# Patient Record
Sex: Female | Born: 1975 | Race: Black or African American | Hispanic: No
Health system: Southern US, Community
[De-identification: ages and names within clinical notes are randomized; demographics above are authoritative.]

## PROBLEM LIST (undated history)

## (undated) DIAGNOSIS — R42 Dizziness and giddiness: Secondary | ICD-10-CM

## (undated) DIAGNOSIS — F419 Anxiety disorder, unspecified: Secondary | ICD-10-CM

## (undated) DIAGNOSIS — K219 Gastro-esophageal reflux disease without esophagitis: Secondary | ICD-10-CM

## (undated) DIAGNOSIS — G43909 Migraine, unspecified, not intractable, without status migrainosus: Secondary | ICD-10-CM

## (undated) HISTORY — PX: ABDOMINAL HYSTERECTOMY: SHX81

## (undated) HISTORY — PX: OTHER SURGICAL HISTORY: SHX169

---

## 2004-12-31 ENCOUNTER — Emergency Department: Payer: Self-pay | Admitting: Emergency Medicine

## 2005-07-30 ENCOUNTER — Emergency Department: Payer: Self-pay | Admitting: Unknown Physician Specialty

## 2006-08-11 ENCOUNTER — Emergency Department: Payer: Self-pay | Admitting: Emergency Medicine

## 2007-01-26 ENCOUNTER — Emergency Department: Payer: Self-pay | Admitting: Emergency Medicine

## 2007-06-16 ENCOUNTER — Emergency Department: Payer: Self-pay | Admitting: Emergency Medicine

## 2007-07-26 ENCOUNTER — Emergency Department: Payer: Self-pay | Admitting: Emergency Medicine

## 2007-12-24 ENCOUNTER — Other Ambulatory Visit: Payer: Self-pay

## 2007-12-24 ENCOUNTER — Emergency Department: Payer: Self-pay | Admitting: Emergency Medicine

## 2008-06-01 ENCOUNTER — Emergency Department: Payer: Self-pay | Admitting: Emergency Medicine

## 2008-06-01 ENCOUNTER — Other Ambulatory Visit: Payer: Self-pay

## 2008-06-28 ENCOUNTER — Emergency Department: Payer: Self-pay | Admitting: Emergency Medicine

## 2008-08-15 ENCOUNTER — Emergency Department: Payer: Self-pay | Admitting: Unknown Physician Specialty

## 2008-08-21 ENCOUNTER — Emergency Department: Payer: Self-pay | Admitting: Emergency Medicine

## 2008-08-21 ENCOUNTER — Emergency Department: Payer: Self-pay | Admitting: Internal Medicine

## 2008-12-28 ENCOUNTER — Emergency Department: Payer: Self-pay | Admitting: Emergency Medicine

## 2009-02-09 ENCOUNTER — Emergency Department: Payer: Self-pay | Admitting: Emergency Medicine

## 2009-06-05 ENCOUNTER — Emergency Department (HOSPITAL_COMMUNITY): Admission: EM | Admit: 2009-06-05 | Discharge: 2009-06-05 | Payer: Self-pay | Admitting: Emergency Medicine

## 2009-10-09 ENCOUNTER — Emergency Department: Payer: Self-pay | Admitting: Emergency Medicine

## 2011-01-19 LAB — URINALYSIS, ROUTINE W REFLEX MICROSCOPIC
Ketones, ur: NEGATIVE mg/dL
Leukocytes, UA: NEGATIVE
Nitrite: NEGATIVE
Urobilinogen, UA: 0.2 mg/dL (ref 0.0–1.0)
pH: 8 (ref 5.0–8.0)

## 2011-01-19 LAB — GC/CHLAMYDIA PROBE AMP, GENITAL
Chlamydia, DNA Probe: NEGATIVE
GC Probe Amp, Genital: NEGATIVE

## 2011-01-19 LAB — DIFFERENTIAL
Basophils Absolute: 0 10*3/uL (ref 0.0–0.1)
Basophils Relative: 0 % (ref 0–1)
Lymphocytes Relative: 36 % (ref 12–46)
Neutro Abs: 2.6 10*3/uL (ref 1.7–7.7)
Neutrophils Relative %: 57 % (ref 43–77)

## 2011-01-19 LAB — POCT I-STAT, CHEM 8
BUN: 4 mg/dL — ABNORMAL LOW (ref 6–23)
Calcium, Ion: 1.12 mmol/L (ref 1.12–1.32)
Chloride: 104 mEq/L (ref 96–112)
HCT: 43 % (ref 36.0–46.0)
Potassium: 3.8 mEq/L (ref 3.5–5.1)
Sodium: 140 mEq/L (ref 135–145)

## 2011-01-19 LAB — WET PREP, GENITAL
Trich, Wet Prep: NONE SEEN
Yeast Wet Prep HPF POC: NONE SEEN

## 2011-01-19 LAB — CBC
Hemoglobin: 13.2 g/dL (ref 12.0–15.0)
MCHC: 32.9 g/dL (ref 30.0–36.0)
Platelets: 303 10*3/uL (ref 150–400)
RDW: 14.3 % (ref 11.5–15.5)

## 2011-01-19 LAB — URINE MICROSCOPIC-ADD ON

## 2011-05-29 ENCOUNTER — Observation Stay: Payer: Self-pay

## 2011-11-23 ENCOUNTER — Encounter (HOSPITAL_COMMUNITY): Payer: Self-pay

## 2011-11-23 ENCOUNTER — Emergency Department (INDEPENDENT_AMBULATORY_CARE_PROVIDER_SITE_OTHER)
Admission: EM | Admit: 2011-11-23 | Discharge: 2011-11-23 | Disposition: A | Discharge status: Home / Self Care | Payer: Self-pay | Source: Home / Self Care | Attending: Family Medicine | Admitting: Family Medicine

## 2011-11-23 DIAGNOSIS — K0889 Other specified disorders of teeth and supporting structures: Secondary | ICD-10-CM

## 2011-11-23 DIAGNOSIS — K089 Disorder of teeth and supporting structures, unspecified: Secondary | ICD-10-CM

## 2011-11-23 MED ORDER — DICLOFENAC POTASSIUM 50 MG PO TABS: 50.0000 mg | ORAL_TABLET | Freq: Three times a day (TID) | ORAL | Status: AC

## 2011-11-23 MED ORDER — CLINDAMYCIN HCL 150 MG PO CAPS: 150.0000 mg | ORAL_CAPSULE | Freq: Four times a day (QID) | ORAL | Status: AC

## 2011-11-23 NOTE — ED Notes (Signed)
Pt has lt sided lower toothache that started two weeks ago.

## 2011-11-23 NOTE — ED Provider Notes (Signed)
History     CSN: [id number]  Arrival date & time 11/23/11  7829   First MD Initiated Contact with Patient 11/23/11 1923      No chief complaint on file.   (Consider location/radiation/quality/duration/timing/severity/associated sxs/prior treatment) Patient is a 36 y.o. female presenting with tooth pain. The history is provided by the patient.  Dental PainThe primary symptoms include mouth pain. Primary symptoms do not include fever. The symptoms began more than 1 week ago. The symptoms are worsening. The symptoms occur constantly.  Additional symptoms include: dental sensitivity to temperature, jaw pain and ear pain.    No past medical history on file.  No past surgical history on file.  No family history on file.  History  Substance Use Topics  . Smoking status: Not on file  . Smokeless tobacco: Not on file  . Alcohol Use: Not on file    OB History    No data available      Review of Systems  Constitutional: Negative.  Negative for fever.  HENT: Positive for ear pain and dental problem.     Allergies  Review of patient's allergies indicates not on file.  Home Medications   Current Outpatient Rx  Name Route Sig Dispense Refill  . CLINDAMYCIN HCL 150 MG PO CAPS Oral Take 1 capsule (150 mg total) by mouth every 6 (six) hours. Start with 2 caps tonight 28 capsule 0  . DICLOFENAC POTASSIUM 50 MG PO TABS Oral Take 1 tablet (50 mg total) by mouth 3 (three) times daily. For tooth pain 15 tablet 0    BP 137/82  Pulse 84  Temp(Src) 98.6 F (37 C) (Oral)  Resp 20  SpO2 99%  Physical Exam  Nursing note and vitals reviewed. Constitutional: She is oriented to person, place, and time. She appears well-developed and well-nourished. She appears distressed.  HENT:  Head: Normocephalic.  Right Ear: External ear normal.  Left Ear: External ear normal.  Mouth/Throat:    Neck: Normal range of motion. Neck supple.  Lymphadenopathy:    She has cervical adenopathy.    Neurological: She is alert and oriented to person, place, and time.  Skin: Skin is warm and dry.  Psychiatric: She has a normal mood and affect.    ED Course  Procedures (including critical care time)  Labs Reviewed - No data to display No results found.   1. Pain, dental       MDM         Barkley Bruns, MD 11/23/11 [phone]

## 2012-05-20 ENCOUNTER — Ambulatory Visit: Payer: Self-pay | Admitting: Obstetrics and Gynecology

## 2012-05-20 LAB — CBC
MCH: 25.6 pg — ABNORMAL LOW (ref 26.0–34.0)
Platelet: 314 10*3/uL (ref 150–440)
RBC: 4.99 10*6/uL (ref 3.80–5.20)
WBC: 6 10*3/uL (ref 3.6–11.0)

## 2012-05-20 LAB — URINALYSIS, COMPLETE
Bacteria: NONE SEEN
Glucose,UR: NEGATIVE mg/dL (ref 0–75)
Ketone: NEGATIVE
Leukocyte Esterase: NEGATIVE
Nitrite: NEGATIVE
Ph: 5 (ref 4.5–8.0)
Protein: 30
Specific Gravity: 1.029 (ref 1.003–1.030)
WBC UR: 1 /HPF (ref 0–5)

## 2012-05-20 LAB — PREGNANCY, URINE: Pregnancy Test, Urine: NEGATIVE m[IU]/mL

## 2012-05-22 ENCOUNTER — Ambulatory Visit: Payer: Self-pay | Admitting: Obstetrics and Gynecology

## 2012-05-23 LAB — HEMATOCRIT: HCT: 33.3 % — ABNORMAL LOW (ref 35.0–47.0)

## 2012-08-16 ENCOUNTER — Encounter (HOSPITAL_COMMUNITY): Payer: Self-pay | Admitting: Emergency Medicine

## 2012-08-16 ENCOUNTER — Emergency Department (INDEPENDENT_AMBULATORY_CARE_PROVIDER_SITE_OTHER)
Admission: EM | Admit: 2012-08-16 | Discharge: 2012-08-16 | Disposition: A | Discharge status: Home / Self Care | Payer: Medicaid Other | Source: Home / Self Care | Attending: Emergency Medicine | Admitting: Emergency Medicine

## 2012-08-16 DIAGNOSIS — J019 Acute sinusitis, unspecified: Secondary | ICD-10-CM

## 2012-08-16 DIAGNOSIS — J069 Acute upper respiratory infection, unspecified: Secondary | ICD-10-CM

## 2012-08-16 DIAGNOSIS — J209 Acute bronchitis, unspecified: Secondary | ICD-10-CM

## 2012-08-16 LAB — POCT RAPID STREP A: Streptococcus, Group A Screen (Direct): NEGATIVE

## 2012-08-16 MED ORDER — AMOXICILLIN 500 MG PO CAPS: 1000.0000 mg | ORAL_CAPSULE | Freq: Three times a day (TID) | ORAL | Status: DC

## 2012-08-16 MED ORDER — PREDNISONE 5 MG PO KIT: 1.0000 | PACK | Freq: Every day | ORAL | Status: DC

## 2012-08-16 MED ORDER — ALBUTEROL SULFATE HFA 108 (90 BASE) MCG/ACT IN AERS: 1.0000 | INHALATION_SPRAY | Freq: Four times a day (QID) | RESPIRATORY_TRACT | Status: DC | PRN

## 2012-08-16 MED ORDER — BENZONATATE 200 MG PO CAPS: 200.0000 mg | ORAL_CAPSULE | Freq: Three times a day (TID) | ORAL | Status: DC | PRN

## 2012-08-16 NOTE — ED Provider Notes (Signed)
Chief Complaint  Patient presents with  . URI    ;cold symptoms x 1 wk. sore throat, nausea, chest pain with cough.     History of Present Illness:   The patient is a 36 year old female who presents tonight with a one-week history of cough productive of green sputum, along with sternal chest pain which is worse with deep inspiration and coughing. She also has nasal congestion and rhinorrhea with green drainage, headache, and popping of the ears. She feels achy all over, hot and cold, has had sweats, malaise, and fatigue. She had sore throat and hoarseness in her eyes have been watering. She's felt somewhat nauseated but had no vomiting but she has had some diarrhea. She's had no specific exposures to anything in particular. She tried Alka-Seltzer cold without relief. She is allergic to aspirin. She is status post hysterectomy. She has a history of asthma in the past.  Review of Systems:  Other than noted above, the patient denies any of the following symptoms. Systemic:  No fever, chills, sweats, fatigue, myalgias, headache, or anorexia. Eye:  No redness, pain or drainage. ENT:  No earache, ear congestion, nasal congestion, sneezing, rhinorrhea, sinus pressure, sinus pain, post nasal drip, or sore throat. Lungs:  No cough, sputum production, wheezing, shortness of breath, or chest pain. GI:  No abdominal pain, nausea, vomiting, or diarrhea.  PMFSH:  Past medical history, family history, social history, meds, and allergies were reviewed.  Physical Exam:   Vital signs:  BP 132/87  Pulse 90  Temp 98.6 F (37 C) (Oral)  Resp 18  SpO2 96%  LMP 11/01/2011 General:  Alert, in no distress. Eye:  No conjunctival injection or drainage. Lids were normal. ENT:  TMs and canals were normal, without erythema or inflammation.  Nasal mucosa was clear and uncongested, without drainage.  Mucous membranes were moist.  Pharynx was clear, without exudate or drainage.  There were no oral ulcerations or  lesions. Neck:  Supple, no adenopathy, tenderness or mass. Lungs:  No respiratory distress.  Lungs were clear to auscultation, without wheezes, rales or rhonchi.  Breath sounds were clear and equal bilaterally.  Heart:  Regular rhythm, without gallops, murmers or rubs. Skin:  Clear, warm, and dry, without rash or lesions.  Labs:   Results for orders placed during the hospital encounter of 08/16/12  POCT RAPID STREP A ([facility] ONLY)      Component Value Range   Streptococcus, Group A Screen (Direct) NEGATIVE  NEGATIVE   Assessment:  The primary encounter diagnosis was Viral upper respiratory infection. Diagnoses of Acute bronchitis and Acute sinusitis were also pertinent to this visit.  Plan:   1.  The following meds were prescribed:   New Prescriptions   ALBUTEROL (PROVENTIL HFA;VENTOLIN HFA) 108 (90 BASE) MCG/ACT INHALER    Inhale 1-2 puffs into the lungs every 6 (six) hours as needed for wheezing.   AMOXICILLIN (AMOXIL) 500 MG CAPSULE    Take 2 capsules (1,000 mg total) by mouth 3 (three) times daily.   BENZONATATE (TESSALON) 200 MG CAPSULE    Take 1 capsule (200 mg total) by mouth 3 (three) times daily as needed for cough.   PREDNISONE 5 MG KIT    Take 1 kit (5 mg total) by mouth daily after breakfast. Prednisone 5 mg 6 day dosepack.  Take as directed.   2.  The patient was instructed in symptomatic care and handouts were given. 3.  The patient was told to return if becoming  worse in any way, if no better in 3 or 4 days, and given some red flag symptoms that would indicate earlier return.   Reuben Likes, MD 08/16/12 [phone]

## 2012-08-16 NOTE — ED Notes (Signed)
Pt states that symptoms started a week ago.   Pt c/o HA, chest pain with productive cough (green sputum), nausea, runny nose Pt has felt feverish but did not check temp.   Cough keeps her awake at night. First couple days of sickness pt did have diarrhea that has subsided.   Denies vomiting.

## 2012-09-22 ENCOUNTER — Emergency Department (INDEPENDENT_AMBULATORY_CARE_PROVIDER_SITE_OTHER)
Admission: EM | Admit: 2012-09-22 | Discharge: 2012-09-22 | Disposition: A | Discharge status: Home / Self Care | Payer: Medicaid Other | Source: Home / Self Care | Attending: Emergency Medicine | Admitting: Emergency Medicine

## 2012-09-22 ENCOUNTER — Encounter (HOSPITAL_COMMUNITY): Payer: Self-pay | Admitting: Emergency Medicine

## 2012-09-22 DIAGNOSIS — L989 Disorder of the skin and subcutaneous tissue, unspecified: Secondary | ICD-10-CM

## 2012-09-22 DIAGNOSIS — R51 Headache: Secondary | ICD-10-CM

## 2012-09-22 MED ORDER — ACETAMINOPHEN-CODEINE #3 300-30 MG PO TABS: 1.0000 | ORAL_TABLET | Freq: Four times a day (QID) | ORAL | Status: DC | PRN

## 2012-09-22 NOTE — ED Notes (Signed)
Reports having a headache for a month, pain eases, but has not gone away.  Reports cough at night.  Minimal sniffles, no known fever.  Dizziness has been intermittent the same duration as headache.  Patient also concerned for knot on back of head that she noticed yesterday once her weave was removed.

## 2012-09-22 NOTE — ED Provider Notes (Signed)
History     CSN: [id number]  Arrival date & time 09/22/12  1224   First MD Initiated Contact with Patient 09/22/12 1314      Chief Complaint  Patient presents with  . Headache    (Consider location/radiation/quality/duration/timing/severity/associated sxs/prior treatment) HPI Comments: Patient presents urgent care complaining that for about a month she's been having a headache, it comes and goes and she has also noticed a little bump that is sore on the left side of her scalp area. Also describes that she used to have migraines and she feels this is very similar and she is also nauseous and that light bothers her a bit. Patient denies any further symptoms such as visual changes, ambulation or gait problems, paresthesias.   Patient is a 36 y.o. female presenting with headaches. The history is provided by the patient.  Headache The primary symptoms include headaches and dizziness. Primary symptoms do not include syncope, loss of consciousness, altered mental status, seizures, visual change, paresthesias, focal weakness, loss of sensation, speech change, memory loss, fever, nausea or vomiting. Episode onset: an=bout 1 month. The symptoms are unchanged.  The headache is not associated with visual change, neck stiffness, paresthesias or weakness.  Dizziness does not occur with tinnitus, nausea, vomiting or weakness.  Additional symptoms do not include neck stiffness, weakness, taste disturbance, hearing loss, tinnitus or vertigo. Medical issues do not include cerebral vascular accident, alcohol use or hypertension.    Past Medical History  Diagnosis Date  . Asthma     Past Surgical History  Procedure Date  . Abdominal hysterectomy     No family history on file.  History  Substance Use Topics  . Smoking status: Never Smoker   . Smokeless tobacco: Not on file  . Alcohol Use: No    OB History    Grav Para Term Preterm Abortions TAB SAB Ect Mult Living                   Review of Systems  Constitutional: Positive for activity change. Negative for fever, chills and appetite change.  HENT: Negative for hearing loss, neck stiffness and tinnitus.   Cardiovascular: Negative for syncope.  Gastrointestinal: Negative for nausea and vomiting.  Skin: Negative for rash and wound.  Neurological: Positive for dizziness and headaches. Negative for vertigo, speech change, focal weakness, seizures, loss of consciousness, weakness and paresthesias.  Psychiatric/Behavioral: Negative for memory loss and altered mental status.    Allergies  Aspirin  Home Medications   Current Outpatient Rx  Name  Route  Sig  Dispense  Refill  . ACETAMINOPHEN 500 MG PO TABS   Oral   Take 500 mg by mouth every 6 (six) hours as needed.         . ACETAMINOPHEN-CODEINE #3 300-30 MG PO TABS   Oral   Take 1-2 tablets by mouth every 6 (six) hours as needed for pain.   15 tablet   0   . ALBUTEROL SULFATE HFA 108 (90 BASE) MCG/ACT IN AERS   Inhalation   Inhale 1-2 puffs into the lungs every 6 (six) hours as needed for wheezing.   1 Inhaler   0   . AMOXICILLIN 500 MG PO CAPS   Oral   Take 2 capsules (1,000 mg total) by mouth 3 (three) times daily.   60 capsule   0     Dispense as written.   Marland Kitchen BENZONATATE 200 MG PO CAPS   Oral   Take 1 capsule (200 mg  total) by mouth 3 (three) times daily as needed for cough.   30 capsule   0   . DICLOFENAC POTASSIUM 50 MG PO TABS   Oral   Take 1 tablet (50 mg total) by mouth 3 (three) times daily. For tooth pain   15 tablet   0   . PREDNISONE 5 MG PO KIT   Oral   Take 1 kit (5 mg total) by mouth daily after breakfast. Prednisone 5 mg 6 day dosepack.  Take as directed.   1 kit   0     LMP 11/01/2011  Physical Exam  Nursing note and vitals reviewed. Constitutional: She is oriented to person, place, and time. Vital signs are normal. She appears well-developed and well-nourished.  Non-toxic appearance. She does not have a  sickly appearance. No distress.  HENT:  Head: Normocephalic.  Eyes: Conjunctivae normal are normal.  Neck: Neck supple. No JVD present.  Cardiovascular: Normal rate.   No murmur heard. Pulmonary/Chest: Effort normal and breath sounds normal.  Lymphadenopathy:    She has no cervical adenopathy.  Neurological: She is alert and oriented to person, place, and time. She displays normal reflexes. No cranial nerve deficit. She exhibits normal muscle tone. Coordination normal.  Skin: Skin is warm.  Psychiatric: Her speech is normal. Her mood appears not anxious.    ED Course  Procedures (including critical care time)  Labs Reviewed - No data to display No results found.   1. Headache   2. Scalp lesion       MDM  Tensional versus migraine type headache. Patient has been prescribed a Tylenol No. 3 prescription with 15 tablets and she agrees to followup with her primary care Dr. Patient also seems to have a early sunglasses cyst formation versus a palpable lymphadenopathy on her left parietal region on the left side. Encourage her to use warm compresses to followup per Dr. as if it continues to grow bother her to obtain a referral for elective surgical removal.        Jimmie Molly, MD 09/22/12 1400

## 2013-03-09 ENCOUNTER — Emergency Department (HOSPITAL_COMMUNITY)
Admission: EM | Admit: 2013-03-09 | Discharge: 2013-03-09 | Disposition: A | Discharge status: Home / Self Care | Payer: Self-pay | Source: Home / Self Care

## 2013-03-09 ENCOUNTER — Encounter (HOSPITAL_COMMUNITY): Payer: Self-pay | Admitting: Adult Health

## 2013-03-09 ENCOUNTER — Emergency Department (HOSPITAL_COMMUNITY)
Admission: EM | Admit: 2013-03-09 | Discharge: 2013-03-09 | Disposition: A | Discharge status: Home / Self Care | Payer: Medicaid Other | Attending: Emergency Medicine | Admitting: Emergency Medicine

## 2013-03-09 DIAGNOSIS — J45909 Unspecified asthma, uncomplicated: Secondary | ICD-10-CM | POA: Insufficient documentation

## 2013-03-09 DIAGNOSIS — Y998 Other external cause status: Secondary | ICD-10-CM | POA: Insufficient documentation

## 2013-03-09 DIAGNOSIS — Y929 Unspecified place or not applicable: Secondary | ICD-10-CM | POA: Insufficient documentation

## 2013-03-09 DIAGNOSIS — Z888 Allergy status to other drugs, medicaments and biological substances status: Secondary | ICD-10-CM | POA: Insufficient documentation

## 2013-03-09 DIAGNOSIS — T148XXA Other injury of unspecified body region, initial encounter: Secondary | ICD-10-CM

## 2013-03-09 DIAGNOSIS — X58XXXA Exposure to other specified factors, initial encounter: Secondary | ICD-10-CM | POA: Insufficient documentation

## 2013-03-09 DIAGNOSIS — S335XXA Sprain of ligaments of lumbar spine, initial encounter: Secondary | ICD-10-CM | POA: Insufficient documentation

## 2013-03-09 DIAGNOSIS — Z79899 Other long term (current) drug therapy: Secondary | ICD-10-CM | POA: Insufficient documentation

## 2013-03-09 MED ORDER — OXYCODONE-ACETAMINOPHEN 5-325 MG PO TABS
1.0000 | ORAL_TABLET | Freq: Once | ORAL | Status: AC
  Administered 2013-03-09: 1 via ORAL
  Filled 2013-03-09: qty 1

## 2013-03-09 MED ORDER — IBUPROFEN 200 MG PO TABS
400.0000 mg | ORAL_TABLET | Freq: Once | ORAL | Status: AC
  Administered 2013-03-09: 400 mg via ORAL
  Filled 2013-03-09: qty 2

## 2013-03-09 MED ORDER — CYCLOBENZAPRINE HCL 10 MG PO TABS: 10.0000 mg | ORAL_TABLET | Freq: Two times a day (BID) | ORAL | Status: DC | PRN

## 2013-03-09 MED ORDER — IBUPROFEN 800 MG PO TABS: 800.0000 mg | ORAL_TABLET | Freq: Three times a day (TID) | ORAL | Status: DC

## 2013-03-09 MED ORDER — HYDROCODONE-ACETAMINOPHEN 5-325 MG PO TABS: 1.0000 | ORAL_TABLET | ORAL | Status: DC | PRN

## 2013-03-09 NOTE — ED Notes (Signed)
Presents with left lower back pain that began while reaching for something and then quickly jumping back because she saw a bug. Pt states, "I think I jumped the wrong way. It hurts to move the left arm and walk. No deformity noted. Pain is described as constant and very painful. Injury occurred yesterday. CMS intact.

## 2013-03-09 NOTE — ED Provider Notes (Signed)
History    This chart was scribed for non-physician practitioner, Arnoldo Hooker PA-C working with Glynn Octave, MD by Donne Anon, ED Scribe. This patient was seen in room TR11C/TR11C and the patient's care was started at 1850.   CSN: [id number]  Arrival date & time 03/09/13  1543   First MD Initiated Contact with Patient 03/09/13 1850      Chief Complaint  Patient presents with  . Back Pain    The history is provided by the patient. No language interpreter was used.   HPI Comments: Danielle Pitts is a 37 y.o. female who presents to the Emergency Department complaining of sudden onset, gradually worsening, waxing and waning, moderate right lower back pain which began yesterday when she was reaching for something in a cabinet. She states the pain is worse with deep breaths and certain movements. She denies CP, abdominal pain, nausea, vomiting or any other pain. She reports she is otherwise healthy.    Her PCP is Dr. Bobbye Riggs.  Past Medical History  Diagnosis Date  . Asthma     Past Surgical History  Procedure Laterality Date  . Abdominal hysterectomy      History reviewed. No pertinent family history.  History  Substance Use Topics  . Smoking status: Never Smoker   . Smokeless tobacco: Not on file  . Alcohol Use: No     Review of Systems  Constitutional: Negative for fever.  HENT: Negative for neck pain.   Respiratory: Negative for cough and shortness of breath.   Cardiovascular: Negative for chest pain.  Gastrointestinal: Negative for nausea, vomiting and abdominal pain.  Musculoskeletal: Positive for back pain.    Allergies  Aspirin  Home Medications   Current Outpatient Rx  Name  Route  Sig  Dispense  Refill  . acetaminophen (TYLENOL) 500 MG tablet   Oral   Take 1,000 mg by mouth every 6 (six) hours as needed for pain.          Marland Kitchen albuterol (PROVENTIL HFA;VENTOLIN HFA) 108 (90 BASE) MCG/ACT inhaler   Inhalation   Inhale 1-2 puffs into the  lungs every 6 (six) hours as needed for wheezing.   1 Inhaler   0     BP 136/80  Pulse 96  Temp(Src) 98.3 F (36.8 C) (Oral)  Resp 18  Ht 5\' 8"  (1.727 m)  Wt 298 lb 3.2 oz (135.263 kg)  BMI 45.35 kg/m2  SpO2 100%  LMP 11/01/2011  Physical Exam  Nursing note and vitals reviewed. Constitutional: She is oriented to person, place, and time. She appears well-developed and well-nourished. No distress.  HENT:  Head: Normocephalic and atraumatic.  Eyes: EOM are normal.  Neck: Neck supple. No tracheal deviation present.  Cardiovascular: Normal rate.   Pulmonary/Chest: Effort normal. No respiratory distress. She exhibits tenderness.  Abdominal: Soft. There is no tenderness.  Musculoskeletal: Normal range of motion.  Bilateral parathorasic tenderness worse on left. Left lateral chest wall tenderness to palpation. No muscular swelling or palpable spasm.  Neurological: She is alert and oriented to person, place, and time.  Skin: Skin is warm and dry.  Psychiatric: She has a normal mood and affect. Her behavior is normal.    ED Course  Procedures (including critical care time) DIAGNOSTIC STUDIES: Oxygen Saturation is 100% on room air, normal by my interpretation.    COORDINATION OF CARE: 7:04 PM Discussed treatment plan which includes medication with pt at bedside and pt agreed to plan. Will give note for work.  Labs Reviewed - No data to display No results found.   No diagnosis found.    MDM  Musculoskeletal back pain.    I personally performed the services described in this documentation, which was scribed in my presence. The recorded information has been reviewed and is accurate.       Arnoldo Hooker, PA-C 03/09/13 1915

## 2013-03-10 NOTE — ED Provider Notes (Signed)
Medical screening examination/treatment/procedure(s) were performed by non-physician practitioner and as supervising physician I was immediately available for consultation/collaboration.   Glynn Octave, MD 03/10/13 [phone]

## 2013-04-28 ENCOUNTER — Other Ambulatory Visit (HOSPITAL_COMMUNITY): Payer: Self-pay | Admitting: Family Medicine

## 2013-04-28 DIAGNOSIS — R1030 Lower abdominal pain, unspecified: Secondary | ICD-10-CM

## 2013-04-28 DIAGNOSIS — R609 Edema, unspecified: Secondary | ICD-10-CM

## 2013-04-29 ENCOUNTER — Ambulatory Visit (HOSPITAL_COMMUNITY)
Admission: RE | Admit: 2013-04-29 | Discharge: 2013-04-29 | Disposition: A | Discharge status: Home / Self Care | Payer: Medicaid Other | Source: Ambulatory Visit | Attending: Family Medicine | Admitting: Family Medicine

## 2013-04-29 DIAGNOSIS — M7989 Other specified soft tissue disorders: Secondary | ICD-10-CM | POA: Insufficient documentation

## 2013-04-29 DIAGNOSIS — R609 Edema, unspecified: Secondary | ICD-10-CM

## 2013-04-29 DIAGNOSIS — M79609 Pain in unspecified limb: Secondary | ICD-10-CM

## 2013-04-29 DIAGNOSIS — R1030 Lower abdominal pain, unspecified: Secondary | ICD-10-CM

## 2013-04-29 NOTE — Progress Notes (Signed)
*  PRELIMINARY RESULTS* Vascular Ultrasound Lower extremity venous duplex has been completed.  Preliminary findings: negative for DVT and baker's cyst..  Called report to Darl Pikes at Dr. Idolina Primer office.   Farrel Demark, RDMS, RVT  04/29/2013, 12:11 PM

## 2013-06-15 ENCOUNTER — Emergency Department (HOSPITAL_COMMUNITY)
Admission: EM | Admit: 2013-06-15 | Discharge: 2013-06-15 | Disposition: A | Discharge status: Home / Self Care | Payer: Medicaid Other | Source: Home / Self Care

## 2013-06-15 ENCOUNTER — Encounter (HOSPITAL_COMMUNITY): Payer: Self-pay | Admitting: Emergency Medicine

## 2013-06-15 DIAGNOSIS — G5603 Carpal tunnel syndrome, bilateral upper limbs: Secondary | ICD-10-CM

## 2013-06-15 DIAGNOSIS — R51 Headache: Secondary | ICD-10-CM

## 2013-06-15 DIAGNOSIS — R5383 Other fatigue: Secondary | ICD-10-CM

## 2013-06-15 DIAGNOSIS — R609 Edema, unspecified: Secondary | ICD-10-CM

## 2013-06-15 DIAGNOSIS — R600 Localized edema: Secondary | ICD-10-CM

## 2013-06-15 DIAGNOSIS — L989 Disorder of the skin and subcutaneous tissue, unspecified: Secondary | ICD-10-CM

## 2013-06-15 DIAGNOSIS — R5381 Other malaise: Secondary | ICD-10-CM

## 2013-06-15 DIAGNOSIS — G5600 Carpal tunnel syndrome, unspecified upper limb: Secondary | ICD-10-CM

## 2013-06-15 HISTORY — DX: Anxiety disorder, unspecified: F41.9

## 2013-06-15 LAB — POCT I-STAT, CHEM 8
BUN: 9 mg/dL (ref 6–23)
Calcium, Ion: 1.19 mmol/L (ref 1.12–1.23)
Chloride: 101 mEq/L (ref 96–112)
HCT: 44 % (ref 36.0–46.0)
Sodium: 140 mEq/L (ref 135–145)

## 2013-06-15 NOTE — ED Provider Notes (Signed)
Danielle Pitts is a 37 y.o. female who presents to Urgent Care today for multiple complaints.  1) bilateral feet swelling: Patient has bilateral foot swelling has been started on 20 mg of Lasix daily primary care provider recently for this. She continues to have persistent swelling.  Of note patient is taking meloxicam daily for chronic pain.  2) Fatigue: Patient has occasional lightheadedness and dizziness associated with intermittent chest pain palpitations. She is currently not having any chest pain but is having some palpitations. She does experience some mild to moderate lightheadedness. She denies any vertigo or syncope. She denies any trouble breathing. Patient recently started taking Cymbalta 3) bilateral hand tingling: Patient is tingling in her bilateral hands on the first 3 palmar digits. This is worse with activity and better with rest. No weakness or numbness no neck pain. No injury.    Past Medical History  Diagnosis Date  . Asthma   . Anxiety    History  Substance Use Topics  . Smoking status: Never Smoker   . Smokeless tobacco: Not on file  . Alcohol Use: No   ROS as above Medications reviewed. No current facility-administered medications for this encounter.   Current Outpatient Prescriptions  Medication Sig Dispense Refill  . DULoxetine (CYMBALTA) 60 MG capsule Take 60 mg by mouth daily.      . furosemide (LASIX) 20 MG tablet Take 20 mg by mouth.      . loratadine (CLARITIN) 10 MG tablet Take 10 mg by mouth daily.      . meloxicam (MOBIC) 15 MG tablet Take 15 mg by mouth daily.      Marland Kitchen acetaminophen (TYLENOL) 500 MG tablet Take 1,000 mg by mouth every 6 (six) hours as needed for pain.       Marland Kitchen albuterol (PROVENTIL HFA;VENTOLIN HFA) 108 (90 BASE) MCG/ACT inhaler Inhale 1-2 puffs into the lungs every 6 (six) hours as needed for wheezing.  1 Inhaler  0  . cyclobenzaprine (FLEXERIL) 10 MG tablet Take 1 tablet (10 mg total) by mouth 2 (two) times daily as needed for muscle  spasms.  20 tablet  0  . HYDROcodone-acetaminophen (NORCO/VICODIN) 5-325 MG per tablet Take 1-2 tablets by mouth every 4 (four) hours as needed for pain.  15 tablet  0  . ibuprofen (ADVIL,MOTRIN) 800 MG tablet Take 1 tablet (800 mg total) by mouth 3 (three) times daily.  21 tablet  0    Exam:  BP 133/69  Pulse 94  Temp(Src) 98.5 F (36.9 C) (Oral)  Resp 20  SpO2 100%  LMP 11/01/2011 Gen: Well NAD, obese HEENT: EOMI,  MMM Lungs: CTABL Nl WOB Heart: RRR no MRG Abd: NABS, NT, ND Exts: 1+ edema BL  LE, warm and well perfused.  Neck: Nontender to spinal midline normal neck range of motion negative Spurling's test bilaterally Hands bilaterally: Normal-appearing nontender. Positive Tinel's in bilateral wrists. Positive Phalen's test bilaterally.  Capillary refill and sensation are intact distal bilateral upper extremity.   Twelve-lead EKG shows normal sinus rhythm at 90 beats per minute. Otherwise normal  Results for orders placed during the hospital encounter of 06/15/13 (from the past 24 hour(s))  POCT I-STAT, CHEM 8     Status: Abnormal   Collection Time    06/15/13  5:37 PM      Result Value Range   Sodium 140  135 - 145 mEq/L   Potassium 3.7  3.5 - 5.1 mEq/L   Chloride 101  96 - 112 mEq/L   BUN  9  6 - 23 mg/dL   Creatinine, Ser 0.98  0.50 - 1.10 mg/dL   Glucose, Bld 119 (*) 70 - 99 mg/dL   Calcium, Ion 1.47  8.29 - 1.23 mmol/L   TCO2 28  0 - 100 mmol/L   Hemoglobin 15.0  12.0 - 15.0 g/dL   HCT 56.2  13.0 - 86.5 %   No results found.  Assessment and Plan: 37 y.o. female with  1) fatigue: Unclear etiology. Possibly related to new Cymbalta. Patient clearly clinically well currently with a normal EKG and relatively normal i-STAT chemistry a. Plan to followup with her primary care provider 2) carpal tunnel syndrome bilaterally: Most likely explanation for her new hand tingling. Followup with primary care provider for nerve conduction study testing.  3) bilateral extremity  swelling. Meloxicam insufficient Lasix is the most likely explanation. Plan to discontinue meloxicam and start taking Lasix twice daily. Followup with primary care provider  Discussed warning signs or symptoms. Please see discharge instructions. Patient expresses understanding.      Rodolph Bong, MD 06/15/13 [phone]

## 2013-06-15 NOTE — ED Notes (Signed)
Pt c/o bilateral feet and hand edema onset 1 month Was given Furosemide 20mg  about a month ago Sxs include: bilateral arm pain/numbness, dizziness, blurry vision, w/intermittent SOB and CP Denies: CP and SOB today Alert w/no signs of acute distress.

## 2013-07-16 ENCOUNTER — Emergency Department (HOSPITAL_COMMUNITY)
Admission: EM | Admit: 2013-07-16 | Discharge: 2013-07-16 | Disposition: A | Discharge status: Home / Self Care | Payer: Medicaid Other | Attending: Emergency Medicine | Admitting: Emergency Medicine

## 2013-07-16 ENCOUNTER — Emergency Department (HOSPITAL_COMMUNITY): Payer: Medicaid Other

## 2013-07-16 DIAGNOSIS — F411 Generalized anxiety disorder: Secondary | ICD-10-CM | POA: Insufficient documentation

## 2013-07-16 DIAGNOSIS — R1110 Vomiting, unspecified: Secondary | ICD-10-CM

## 2013-07-16 DIAGNOSIS — Z79899 Other long term (current) drug therapy: Secondary | ICD-10-CM | POA: Insufficient documentation

## 2013-07-16 DIAGNOSIS — E669 Obesity, unspecified: Secondary | ICD-10-CM | POA: Insufficient documentation

## 2013-07-16 DIAGNOSIS — J45909 Unspecified asthma, uncomplicated: Secondary | ICD-10-CM | POA: Insufficient documentation

## 2013-07-16 DIAGNOSIS — R079 Chest pain, unspecified: Secondary | ICD-10-CM

## 2013-07-16 DIAGNOSIS — R112 Nausea with vomiting, unspecified: Secondary | ICD-10-CM | POA: Insufficient documentation

## 2013-07-16 DIAGNOSIS — R072 Precordial pain: Secondary | ICD-10-CM | POA: Insufficient documentation

## 2013-07-16 LAB — CBC
HCT: 39.5 % (ref 36.0–46.0)
Hemoglobin: 12.8 g/dL (ref 12.0–15.0)
MCH: 26.6 pg (ref 26.0–34.0)
MCHC: 32.4 g/dL (ref 30.0–36.0)
MCV: 82.1 fL (ref 78.0–100.0)
Platelets: 310 10*3/uL (ref 150–400)
WBC: 6.2 10*3/uL (ref 4.0–10.5)

## 2013-07-16 LAB — COMPREHENSIVE METABOLIC PANEL
AST: 12 U/L (ref 0–37)
Alkaline Phosphatase: 77 U/L (ref 39–117)
BUN: 6 mg/dL (ref 6–23)
CO2: 26 mEq/L (ref 19–32)
Calcium: 8.6 mg/dL (ref 8.4–10.5)
Chloride: 101 mEq/L (ref 96–112)
Creatinine, Ser: 0.76 mg/dL (ref 0.50–1.10)
GFR calc Af Amer: >90 mL/min (ref >90)
GFR calc non Af Amer: >90 mL/min (ref >90)
Glucose, Bld: 142 mg/dL — ABNORMAL HIGH (ref 70–99)
Total Bilirubin: 0.3 mg/dL (ref 0.3–1.2)
Total Protein: 7.3 g/dL (ref 6.0–8.3)

## 2013-07-16 LAB — TROPONIN I: Troponin I: <0.3 ng/mL (ref <0.30)

## 2013-07-16 LAB — URINALYSIS, ROUTINE W REFLEX MICROSCOPIC
Ketones, ur: NEGATIVE mg/dL
Leukocytes, UA: NEGATIVE
Nitrite: NEGATIVE
Specific Gravity, Urine: 1.016 (ref 1.005–1.030)
Urobilinogen, UA: 0.2 mg/dL (ref 0.0–1.0)
pH: 7.5 (ref 5.0–8.0)

## 2013-07-16 LAB — POCT I-STAT TROPONIN I

## 2013-07-16 MED ORDER — ONDANSETRON HCL 4 MG/2ML IJ SOLN
4.0000 mg | Freq: Once | INTRAMUSCULAR | Status: AC
  Administered 2013-07-16: 4 mg via INTRAVENOUS
  Filled 2013-07-16: qty 2

## 2013-07-16 MED ORDER — MORPHINE SULFATE 4 MG/ML IJ SOLN
4.0000 mg | Freq: Once | INTRAMUSCULAR | Status: AC
  Administered 2013-07-16: 4 mg via INTRAVENOUS
  Filled 2013-07-16: qty 1

## 2013-07-16 MED ORDER — HYDROCODONE-ACETAMINOPHEN 5-325 MG PO TABS: 1.0000 | ORAL_TABLET | Freq: Four times a day (QID) | ORAL | Status: DC | PRN

## 2013-07-16 MED ORDER — ONDANSETRON HCL 4 MG PO TABS: 4.0000 mg | ORAL_TABLET | Freq: Four times a day (QID) | ORAL | Status: DC

## 2013-07-16 NOTE — ED Notes (Signed)
Pt having sudden onset vomting. Dr Redgie Grayer notified. New orders obtained. Pt given emesis bag, new gown, and linens

## 2013-07-16 NOTE — ED Notes (Signed)
Nausea continues; physician notified, order obtained.

## 2013-07-16 NOTE — ED Provider Notes (Signed)
CSN: [id number]     Arrival date & time 07/16/13  1556 History   First MD Initiated Contact with Patient 07/16/13 1604     Chief Complaint  Patient presents with  . Chest Pain   (Consider location/radiation/quality/duration/timing/severity/associated sxs/prior Treatment) HPI Comments: 37 yo AA female presents with cc of CP.  Onset 5 days ago.  Pt is dealing with much stress at home, current divorce.    She is c/o n/v for 5 days.  Pt also has a nonproductive cough.  Denies recent antibiotics.    No previous CAD.  No GXT or cards eval.    Patient is a 37 y.o. female presenting with chest pain. The history is provided by the patient.  Chest Pain Pain location:  Substernal area Pain quality: aching and crushing   Pain quality: not burning   Pain radiates to:  Does not radiate Pain radiates to the back: no   Pain severity:  Mild Onset quality:  Gradual Duration:  2 days Timing:  Constant Chronicity:  Recurrent Associated symptoms: anxiety, nausea and vomiting   Associated symptoms: no abdominal pain and no palpitations   Risk factors: obesity   Risk factors: no birth control, no coronary artery disease, no diabetes mellitus, no high cholesterol, no hypertension, no immobilization, not pregnant, no prior DVT/PE, no smoking and no surgery     Past Medical History  Diagnosis Date  . Asthma   . Anxiety    Past Surgical History  Procedure Laterality Date  . Abdominal hysterectomy     No family history on file. History  Substance Use Topics  . Smoking status: Never Smoker   . Smokeless tobacco: Not on file  . Alcohol Use: No   OB History   Grav Para Term Preterm Abortions TAB SAB Ect Mult Living                 Review of Systems  Constitutional: Negative.   HENT: Negative.   Respiratory: Negative.   Cardiovascular: Positive for chest pain. Negative for palpitations and leg swelling.  Gastrointestinal: Positive for nausea and vomiting. Negative for abdominal pain,  diarrhea, constipation, blood in stool, abdominal distention and anal bleeding.  Endocrine: Negative.   Genitourinary: Negative.   Musculoskeletal: Negative.   Skin: Negative.   Neurological: Negative.   Psychiatric/Behavioral:       Anxiety    Allergies  Aspirin  Home Medications   Current Outpatient Rx  Name  Route  Sig  Dispense  Refill  . acetaminophen (TYLENOL) 500 MG tablet   Oral   Take 1,000 mg by mouth every 6 (six) hours as needed for pain.          Marland Kitchen albuterol (PROVENTIL HFA;VENTOLIN HFA) 108 (90 BASE) MCG/ACT inhaler   Inhalation   Inhale 1-2 puffs into the lungs every 6 (six) hours as needed for wheezing.   1 Inhaler   0   . cyclobenzaprine (FLEXERIL) 10 MG tablet   Oral   Take 1 tablet (10 mg total) by mouth 2 (two) times daily as needed for muscle spasms.   20 tablet   0   . DULoxetine (CYMBALTA) 60 MG capsule   Oral   Take 60 mg by mouth daily.         . furosemide (LASIX) 20 MG tablet   Oral   Take 20 mg by mouth.         . loratadine (CLARITIN) 10 MG tablet   Oral   Take 10 mg  by mouth daily.         . meloxicam (MOBIC) 15 MG tablet   Oral   Take 15 mg by mouth daily.         Marland Kitchen HYDROcodone-acetaminophen (NORCO/VICODIN) 5-325 MG per tablet   Oral   Take 1 tablet by mouth every 6 (six) hours as needed for pain.   6 tablet   0   . ondansetron (ZOFRAN) 4 MG tablet   Oral   Take 1 tablet (4 mg total) by mouth every 6 (six) hours.   12 tablet   0    BP 101/57  Pulse 73  Temp(Src) 100 F (37.8 C) (Oral)  Resp 19  Ht 5\' 7"  (1.702 m)  Wt 287 lb (130.182 kg)  BMI 44.94 kg/m2  SpO2 100%  LMP 11/01/2011 Physical Exam  Nursing note and vitals reviewed. Constitutional: She is oriented to person, place, and time. She appears well-developed and well-nourished.  HENT:  Head: Normocephalic.  Eyes: Conjunctivae are normal. Right eye exhibits no discharge.  Neck: Normal range of motion. Neck supple.  Cardiovascular: Normal rate  and regular rhythm.  Exam reveals no gallop and no friction rub.   No murmur heard. Pulmonary/Chest: Effort normal and breath sounds normal.  Abdominal: Soft. Bowel sounds are normal. She exhibits no distension and no mass. There is tenderness. There is no rebound and no guarding.  Diffuse ttp, no g/r/m, no hsm, no ttp at [place], Neg [place]  Musculoskeletal: Normal range of motion.  Neurological: She is alert and oriented to person, place, and time. She has normal reflexes.  Skin: Skin is warm and dry.    ED Course  Procedures (including critical care time) Labs Review Labs Reviewed  COMPREHENSIVE METABOLIC PANEL - Abnormal; Notable for the following:    Glucose, Bld 142 (*)    All other components within normal limits  CBC  URINALYSIS, ROUTINE W REFLEX MICROSCOPIC  TROPONIN I  LIPASE, BLOOD  POCT I-STAT TROPONIN I   Imaging Review Dg Chest 2 View  07/16/2013   CLINICAL DATA:  Chest pain and cough.  EXAM: CHEST - 2 VIEW  COMPARISON:  None  FINDINGS: The heart size and mediastinal contours are within normal limits. There is no evidence of pulmonary edema, consolidation, pneumothorax, nodule or pleural fluid. The visualized skeletal structures are unremarkable.  IMPRESSION: No active disease.   Electronically Signed   By: Irish Lack M.D.   On: 07/16/2013 17:27     Date: 07/16/2013  Rate: 88  Rhythm: normal sinus rhythm  QRS Axis: normal  Intervals: normal  ST/T Wave abnormalities: normal  Conduction Disutrbances:none  Narrative Interpretation:   Old EKG Reviewed: none available   Date: 07/16/2013 @ 2036  Rate: 81  Rhythm: normal sinus rhythm  QRS Axis: normal  Intervals: normal  ST/T Wave abnormalities: normal  Conduction Disutrbances:none  Narrative Interpretation:   Old EKG Reviewed: unchanged  Results for orders placed during the hospital encounter of 07/16/13  CBC      Result Value Range   WBC 6.2  4.0 - 10.5 K/uL   RBC 4.81  3.87 - 5.11 MIL/uL    Hemoglobin 12.8  12.0 - 15.0 g/dL   HCT 40.9  81.1 - 91.4 %   MCV 82.1  78.0 - 100.0 fL   MCH 26.6  26.0 - 34.0 pg   MCHC 32.4  30.0 - 36.0 g/dL   RDW 78.2  95.6 - 21.3 %   Platelets 310  150 - 400  K/uL  COMPREHENSIVE METABOLIC PANEL      Result Value Range   Sodium 137  135 - 145 mEq/L   Potassium 3.7  3.5 - 5.1 mEq/L   Chloride 101  96 - 112 mEq/L   CO2 26  19 - 32 mEq/L   Glucose, Bld 142 (*) 70 - 99 mg/dL   BUN 6  6 - 23 mg/dL   Creatinine, Ser 4.09  0.50 - 1.10 mg/dL   Calcium 8.6  8.4 - 81.1 mg/dL   Total Protein 7.3  6.0 - 8.3 g/dL   Albumin 3.5  3.5 - 5.2 g/dL   AST 12  0 - 37 U/L   ALT 10  0 - 35 U/L   Alkaline Phosphatase 77  39 - 117 U/L   Total Bilirubin 0.3  0.3 - 1.2 mg/dL   GFR calc non Af Amer >90  >90 mL/min   GFR calc Af Amer >90  >90 mL/min  URINALYSIS, ROUTINE W REFLEX MICROSCOPIC      Result Value Range   Color, Urine YELLOW  YELLOW   APPearance CLEAR  CLEAR   Specific Gravity, Urine 1.016  1.005 - 1.030   pH 7.5  5.0 - 8.0   Glucose, UA NEGATIVE  NEGATIVE mg/dL   Hgb urine dipstick NEGATIVE  NEGATIVE   Bilirubin Urine NEGATIVE  NEGATIVE   Ketones, ur NEGATIVE  NEGATIVE mg/dL   Protein, ur NEGATIVE  NEGATIVE mg/dL   Urobilinogen, UA 0.2  0.0 - 1.0 mg/dL   Nitrite NEGATIVE  NEGATIVE   Leukocytes, UA NEGATIVE  NEGATIVE  TROPONIN I      Result Value Range   Troponin I <0.30  <0.30 ng/mL  LIPASE, BLOOD      Result Value Range   Lipase 16  11 - 59 U/L  POCT I-STAT TROPONIN I      Result Value Range   Troponin i, poc 0.00  0.00 - 0.08 ng/mL   Comment 3              MDM   1. Chest pain   2. Vomiting    37 year old [place] American female presents emergency part with chief complaint of chest pain. Patient also has complaint of nausea and vomiting. Her symptoms have been present for 5 days. Her vital signs are stable, her physical exam is unremarkable with the exception of diffuse mild tenderness palpation to the abdomen. EKG obtained in triage  reveals normal sinus rhythm without ischemic changes. Plan for chest x-ray, baseline labs, and medication for nausea and vomiting and pain.  3:08 AM VSS.  Symptoms improved.  Plan for two set r/o.  Pt updated.  3:08 AM VSS.  Pt in nad. Repeat EKG normal.  Await trop # 2.  Second trop negative.  Pt with persistent nausea.  Will check a Lipase and redose zofran.  Lipase negative.  I do not suspect ACS. 2 set rule out negative. Patient is a young female with minimal cardiac risk factors. She will require an outpatient workup and risk stratification however. Doubt lipase, surgical abdomen, serious bacterial illness. Patient without pelvic symptoms or lower abdominal pain therefore no pelvic exam was performed. ER precautions were given and patient will return for fever, by mouth intolerance, increasing abdominal pain and at that time she may require a CT of her abdomen pelvis or other evaluation. Patient agrees with plan and will be discharged with Zofran and Naprosyn. ER precautions were given.    Darlys Gales, MD 07/17/13  0309 

## 2013-07-16 NOTE — ED Notes (Signed)
Pt can have PO fluids per Dr Redgie Grayer. Pt given water

## 2013-07-16 NOTE — ED Notes (Signed)
Pt c/o CP, SHOB, dizziness, N/V x 2 days. Pt verbalizes this recurrent but has been worsening. Pt describes pain as Mid sternal that is heavy and dull in nature

## 2013-07-16 NOTE — ED Notes (Signed)
Pt ambulatory back to room 14 at this time.

## 2013-07-16 NOTE — ED Notes (Signed)
Introduced self to pt.  Pt reattached to bedside monitor.  Reports chest pain had calmed, is now /ncreasing, rated 7/10.  Unsure if increased pain is due to recent emesis.  Will check to see if zofran is helping in a few minutes.  NSR on monitor, NIBP stable.

## 2013-07-16 NOTE — ED Notes (Signed)
Pt c/o CP, SHOB, dizziness, N/V x 2 days. Pt verbalizes this recurrent but has been worsening. Pt describes pain as Mid sternal that is heavy and dull in nature 

## 2013-07-18 ENCOUNTER — Emergency Department (HOSPITAL_COMMUNITY)
Admission: EM | Admit: 2013-07-18 | Discharge: 2013-07-19 | Disposition: A | Discharge status: Home / Self Care | Payer: Medicaid Other | Attending: Emergency Medicine | Admitting: Emergency Medicine

## 2013-07-18 ENCOUNTER — Emergency Department (HOSPITAL_COMMUNITY): Payer: Medicaid Other

## 2013-07-18 ENCOUNTER — Encounter (HOSPITAL_COMMUNITY): Payer: Self-pay | Admitting: *Deleted

## 2013-07-18 DIAGNOSIS — Z79899 Other long term (current) drug therapy: Secondary | ICD-10-CM | POA: Insufficient documentation

## 2013-07-18 DIAGNOSIS — R5381 Other malaise: Secondary | ICD-10-CM | POA: Insufficient documentation

## 2013-07-18 DIAGNOSIS — J45909 Unspecified asthma, uncomplicated: Secondary | ICD-10-CM | POA: Insufficient documentation

## 2013-07-18 DIAGNOSIS — R51 Headache: Secondary | ICD-10-CM | POA: Insufficient documentation

## 2013-07-18 DIAGNOSIS — F411 Generalized anxiety disorder: Secondary | ICD-10-CM | POA: Insufficient documentation

## 2013-07-18 DIAGNOSIS — R002 Palpitations: Secondary | ICD-10-CM | POA: Insufficient documentation

## 2013-07-18 DIAGNOSIS — Z791 Long term (current) use of non-steroidal anti-inflammatories (NSAID): Secondary | ICD-10-CM | POA: Insufficient documentation

## 2013-07-18 DIAGNOSIS — E669 Obesity, unspecified: Secondary | ICD-10-CM | POA: Insufficient documentation

## 2013-07-18 DIAGNOSIS — R112 Nausea with vomiting, unspecified: Secondary | ICD-10-CM | POA: Insufficient documentation

## 2013-07-18 DIAGNOSIS — R519 Headache, unspecified: Secondary | ICD-10-CM

## 2013-07-18 DIAGNOSIS — F419 Anxiety disorder, unspecified: Secondary | ICD-10-CM

## 2013-07-18 DIAGNOSIS — H53149 Visual discomfort, unspecified: Secondary | ICD-10-CM | POA: Insufficient documentation

## 2013-07-18 DIAGNOSIS — R079 Chest pain, unspecified: Secondary | ICD-10-CM | POA: Insufficient documentation

## 2013-07-18 HISTORY — DX: Dizziness and giddiness: R42

## 2013-07-18 LAB — POCT I-STAT TROPONIN I: Troponin i, poc: 0 ng/mL (ref 0.00–0.08)

## 2013-07-18 LAB — BASIC METABOLIC PANEL
CO2: 28 mEq/L (ref 19–32)
Calcium: 8.9 mg/dL (ref 8.4–10.5)
Creatinine, Ser: 0.84 mg/dL (ref 0.50–1.10)
GFR calc Af Amer: >90 mL/min (ref >90)
GFR calc non Af Amer: 88 mL/min — ABNORMAL LOW (ref >90)
Glucose, Bld: 89 mg/dL (ref 70–99)
Sodium: 140 mEq/L (ref 135–145)

## 2013-07-18 LAB — CBC
Hemoglobin: 13.2 g/dL (ref 12.0–15.0)
MCH: 25.9 pg — ABNORMAL LOW (ref 26.0–34.0)
MCHC: 31.4 g/dL (ref 30.0–36.0)
MCV: 82.7 fL (ref 78.0–100.0)
Platelets: 357 10*3/uL (ref 150–400)
RBC: 5.09 MIL/uL (ref 3.87–5.11)

## 2013-07-18 MED ORDER — SODIUM CHLORIDE 0.9 % IV BOLUS (SEPSIS)
1000.0000 mL | INTRAVENOUS | Status: AC
  Administered 2013-07-18: 1000 mL via INTRAVENOUS

## 2013-07-18 MED ORDER — MORPHINE SULFATE 4 MG/ML IJ SOLN: 4.0000 mg | Freq: Once | INTRAMUSCULAR | Status: DC

## 2013-07-18 MED ORDER — MORPHINE SULFATE 4 MG/ML IJ SOLN
4.0000 mg | Freq: Once | INTRAMUSCULAR | Status: AC
  Administered 2013-07-18: 4 mg via INTRAVENOUS
  Filled 2013-07-18: qty 1

## 2013-07-18 MED ORDER — KETOROLAC TROMETHAMINE 30 MG/ML IJ SOLN
30.0000 mg | Freq: Once | INTRAMUSCULAR | Status: AC
  Administered 2013-07-18: 30 mg via INTRAVENOUS
  Filled 2013-07-18: qty 1

## 2013-07-18 MED ORDER — MORPHINE SULFATE 4 MG/ML IJ SOLN: 6.0000 mg | Freq: Once | INTRAMUSCULAR | Status: DC

## 2013-07-18 MED ORDER — DIPHENHYDRAMINE HCL 50 MG/ML IJ SOLN
25.0000 mg | Freq: Once | INTRAMUSCULAR | Status: AC
  Administered 2013-07-18: 25 mg via INTRAVENOUS
  Filled 2013-07-18: qty 1

## 2013-07-18 MED ORDER — PROCHLORPERAZINE EDISYLATE 5 MG/ML IJ SOLN
10.0000 mg | Freq: Once | INTRAMUSCULAR | Status: AC
  Administered 2013-07-18: 10 mg via INTRAVENOUS
  Filled 2013-07-18: qty 2

## 2013-07-18 NOTE — ED Provider Notes (Signed)
CSN: [id number]     Arrival date & time 07/18/13  2057 History   First MD Initiated Contact with Patient 07/18/13 2123     Chief Complaint  Patient presents with  . Chest Pain  . Palpitations   (Consider location/radiation/quality/duration/timing/severity/associated sxs/prior Treatment) HPI Pt is a 37yo female with hx of anxiety, asthma, and vertigo c/o chest pain, palpitations, headache, dizziness, nausea and vomiting. Pt was seen on 10/3 for similar chest pain, negative cardiac workup and discharged home.  Pt states since then, symptoms have not improved and headache is worsening, aching throbbing left side of head, 9/10, associated with photophobia.  States it feels like her head is pounding at the same time as her chest. Does report significant "life" stress.  Denies cardiac hx.  Was Rx Vicodin and Zofran but states she did not get her prescriptions filled because she has felt too bad to go to pharmacy to fill them. Denies fever. No recent travel or sick contacts.    Past Medical History  Diagnosis Date  . Asthma   . Anxiety   . Vertigo    Past Surgical History  Procedure Laterality Date  . Abdominal hysterectomy     No family history on file. History  Substance Use Topics  . Smoking status: Never Smoker   . Smokeless tobacco: Not on file  . Alcohol Use: No   OB History   Grav Para Term Preterm Abortions TAB SAB Ect Mult Living                 Review of Systems  Constitutional: Positive for fatigue.  Cardiovascular: Positive for chest pain and palpitations. Negative for leg swelling.  Gastrointestinal: Positive for nausea and vomiting. Negative for abdominal pain, diarrhea and constipation.  Neurological: Positive for headaches. Negative for weakness and numbness.  Psychiatric/Behavioral: The patient is nervous/anxious.   All other systems reviewed and are negative.    Allergies  Aspirin  Home Medications   Current Outpatient Rx  Name  Route  Sig  Dispense   Refill  . DULoxetine (CYMBALTA) 60 MG capsule   Oral   Take 60 mg by mouth daily.         . furosemide (LASIX) 20 MG tablet   Oral   Take 20 mg by mouth.         . loratadine (CLARITIN) 10 MG tablet   Oral   Take 10 mg by mouth daily.         . meloxicam (MOBIC) 15 MG tablet   Oral   Take 15 mg by mouth daily.         Marland Kitchen albuterol (PROVENTIL HFA;VENTOLIN HFA) 108 (90 BASE) MCG/ACT inhaler   Inhalation   Inhale 1-2 puffs into the lungs every 6 (six) hours as needed for wheezing.   1 Inhaler   0   . HYDROcodone-acetaminophen (NORCO/VICODIN) 5-325 MG per tablet   Oral   Take 1 tablet by mouth every 6 (six) hours as needed for pain.   6 tablet   0   . ondansetron (ZOFRAN) 4 MG tablet   Oral   Take 1 tablet (4 mg total) by mouth every 6 (six) hours.   12 tablet   0    BP 112/61  Pulse 88  Temp(Src) 98.7 F (37.1 C) (Oral)  Resp 15  SpO2 100%  LMP 11/01/2011 Physical Exam  Nursing note and vitals reviewed. Constitutional: She is oriented to person, place, and time. She appears well-developed and well-nourished.  Lights turned out, pt covering eyes with her hands. Appears uncomfortable.  HENT:  Head: Normocephalic and atraumatic.  Eyes: Conjunctivae are normal. No scleral icterus.  Neck: Normal range of motion.  Cardiovascular: Normal rate, regular rhythm and normal heart sounds.   Regular rate and rhythm. No M/R/G   Pulmonary/Chest: Effort normal and breath sounds normal. No respiratory distress. She has no wheezes. She has no rales. She exhibits no tenderness.  Abdominal: Soft. Bowel sounds are normal. She exhibits no distension and no mass. There is no tenderness. There is no rebound and no guarding.  Obese abdomen, soft, non tender  Musculoskeletal: Normal range of motion.  Neurological: She is alert and oriented to person, place, and time. No cranial nerve deficit.  Skin: Skin is warm and dry.    ED Course  Procedures (including critical care  time) Labs Review Labs Reviewed  CBC - Abnormal; Notable for the following:    MCH 25.9 (*)    All other components within normal limits  BASIC METABOLIC PANEL - Abnormal; Notable for the following:    GFR calc non Af Amer 88 (*)    All other components within normal limits  POCT I-STAT TROPONIN I   Imaging Review Dg Chest 2 View  07/18/2013   CLINICAL DATA:  Chest pain  EXAM: CHEST  2 VIEW  COMPARISON:  07/16/2013  FINDINGS: The heart size and mediastinal contours are within normal limits. Both lungs are clear. The visualized skeletal structures are unremarkable.  IMPRESSION: No active cardiopulmonary disease.   Electronically Signed   By: Alcide Clever M.D.   On: 07/18/2013 21:32    MDM   1. Headache   2. Palpitations   3. Anxiety    Pt appears anxious and uncomfortable with light sensitive headache. Neuro exam: unremarkable. Not concerned for CVA, TIA, or SAH. CP atypical for ACS and also had recent negative workup for same.  Due to continued chest pain, repeat labs: CBC, BMP, troponin drawn.  All unremarkable.  CXR: unremarkable.  Will give migraine cocktail: morphine, saline, compazine, and benadryl.  When going to reevaluate pt, pt was sleeping, easily awakened and stated headache improved some.  Toradol given.  Discussed pt with Dr. Gwendolyn Grant who agrees pt may be discharged home to f/u with PCP.   All labs/imaging/findings discussed with patient. All questions answered and concerns addressed. Will discharge pt home and have pt f/u with [facility] info provided. Return precautions given. Pt verbalized understanding and agreement with tx plan. Vitals: unremarkable. Discharged in stable condition.    Discussed pt with attending during ED encounter and agrees with plan.     Junius Finner, PA-C 07/19/13 [phone]

## 2013-07-18 NOTE — ED Notes (Signed)
Erin, PA at bedside. 

## 2013-07-18 NOTE — ED Notes (Signed)
Pt states she has been having CP and dizziness for 4 days, states she was seen here 2 dsays prior for same without releif

## 2013-07-19 NOTE — ED Provider Notes (Signed)
Medical screening examination/treatment/procedure(s) were performed by non-physician practitioner and as supervising physician I was immediately available for consultation/collaboration.   Dagmar Hait, MD 07/19/13 [phone]

## 2013-12-31 ENCOUNTER — Encounter (HOSPITAL_COMMUNITY): Payer: Self-pay | Admitting: Emergency Medicine

## 2013-12-31 ENCOUNTER — Emergency Department (HOSPITAL_COMMUNITY): Payer: Medicaid Other

## 2013-12-31 ENCOUNTER — Emergency Department (HOSPITAL_COMMUNITY)
Admission: EM | Admit: 2013-12-31 | Discharge: 2013-12-31 | Disposition: A | Discharge status: Home / Self Care | Payer: Medicaid Other | Attending: Emergency Medicine | Admitting: Emergency Medicine

## 2013-12-31 DIAGNOSIS — R197 Diarrhea, unspecified: Secondary | ICD-10-CM | POA: Insufficient documentation

## 2013-12-31 DIAGNOSIS — Z79899 Other long term (current) drug therapy: Secondary | ICD-10-CM | POA: Insufficient documentation

## 2013-12-31 DIAGNOSIS — R000 Tachycardia, unspecified: Secondary | ICD-10-CM | POA: Insufficient documentation

## 2013-12-31 DIAGNOSIS — R51 Headache: Secondary | ICD-10-CM | POA: Insufficient documentation

## 2013-12-31 DIAGNOSIS — M549 Dorsalgia, unspecified: Secondary | ICD-10-CM | POA: Insufficient documentation

## 2013-12-31 DIAGNOSIS — R112 Nausea with vomiting, unspecified: Secondary | ICD-10-CM

## 2013-12-31 DIAGNOSIS — J45909 Unspecified asthma, uncomplicated: Secondary | ICD-10-CM | POA: Insufficient documentation

## 2013-12-31 DIAGNOSIS — Z791 Long term (current) use of non-steroidal anti-inflammatories (NSAID): Secondary | ICD-10-CM | POA: Insufficient documentation

## 2013-12-31 DIAGNOSIS — R1084 Generalized abdominal pain: Secondary | ICD-10-CM | POA: Insufficient documentation

## 2013-12-31 DIAGNOSIS — R52 Pain, unspecified: Secondary | ICD-10-CM

## 2013-12-31 DIAGNOSIS — F411 Generalized anxiety disorder: Secondary | ICD-10-CM | POA: Insufficient documentation

## 2013-12-31 LAB — CBC WITH DIFFERENTIAL/PLATELET
BASOS ABS: 0 10*3/uL (ref 0.0–0.1)
BASOS PCT: 0 % (ref 0–1)
Eosinophils Absolute: 0 10*3/uL (ref 0.0–0.7)
Eosinophils Relative: 0 % (ref 0–5)
HEMATOCRIT: 43.1 % (ref 36.0–46.0)
Hemoglobin: 14 g/dL (ref 12.0–15.0)
LYMPHS PCT: 33 % (ref 12–46)
Lymphs Abs: 1.4 10*3/uL (ref 0.7–4.0)
MCH: 27 pg (ref 26.0–34.0)
MCHC: 32.5 g/dL (ref 30.0–36.0)
MCV: 83.2 fL (ref 78.0–100.0)
MONO ABS: 0.5 10*3/uL (ref 0.1–1.0)
Monocytes Relative: 11 % (ref 3–12)
Neutro Abs: 2.4 10*3/uL (ref 1.7–7.7)
Neutrophils Relative %: 55 % (ref 43–77)
PLATELETS: 269 10*3/uL (ref 150–400)
RBC: 5.18 MIL/uL — ABNORMAL HIGH (ref 3.87–5.11)
RDW: 13.3 % (ref 11.5–15.5)
WBC: 4.3 10*3/uL (ref 4.0–10.5)

## 2013-12-31 LAB — COMPREHENSIVE METABOLIC PANEL
ALK PHOS: 80 U/L (ref 39–117)
ALT: 11 U/L (ref 0–35)
AST: 15 U/L (ref 0–37)
Albumin: 3.6 g/dL (ref 3.5–5.2)
BILIRUBIN TOTAL: 0.5 mg/dL (ref 0.3–1.2)
BUN: 9 mg/dL (ref 6–23)
CHLORIDE: 99 meq/L (ref 96–112)
CO2: 24 mEq/L (ref 19–32)
Calcium: 8.6 mg/dL (ref 8.4–10.5)
Creatinine, Ser: 0.96 mg/dL (ref 0.50–1.10)
GFR calc Af Amer: 87 mL/min — ABNORMAL LOW (ref >90)
GFR calc non Af Amer: 75 mL/min — ABNORMAL LOW (ref >90)
GLUCOSE: 103 mg/dL — AB (ref 70–99)
POTASSIUM: 3.7 meq/L (ref 3.7–5.3)
SODIUM: 139 meq/L (ref 137–147)
TOTAL PROTEIN: 7.4 g/dL (ref 6.0–8.3)

## 2013-12-31 LAB — LIPASE, BLOOD: LIPASE: 18 U/L (ref 11–59)

## 2013-12-31 MED ORDER — MORPHINE SULFATE 4 MG/ML IJ SOLN
4.0000 mg | Freq: Once | INTRAMUSCULAR | Status: AC
  Administered 2013-12-31: 4 mg via INTRAVENOUS
  Filled 2013-12-31: qty 1

## 2013-12-31 MED ORDER — SODIUM CHLORIDE 0.9 % IV BOLUS (SEPSIS)
1000.0000 mL | Freq: Once | INTRAVENOUS | Status: AC
  Administered 2013-12-31: 1000 mL via INTRAVENOUS

## 2013-12-31 MED ORDER — ALBUTEROL SULFATE (2.5 MG/3ML) 0.083% IN NEBU
2.5000 mg | INHALATION_SOLUTION | Freq: Once | RESPIRATORY_TRACT | Status: AC
  Administered 2013-12-31: 2.5 mg via RESPIRATORY_TRACT
  Filled 2013-12-31: qty 3

## 2013-12-31 NOTE — Discharge Instructions (Signed)
Viral Gastroenteritis Viral gastroenteritis is also known as stomach flu. This condition affects the stomach and intestinal tract. It can cause sudden diarrhea and vomiting. The illness typically lasts 3 to 8 days. Most people develop an immune response that eventually gets rid of the virus. While this natural response develops, the virus can make you quite ill. CAUSES  Many different viruses can cause gastroenteritis, such as rotavirus or noroviruses. You can catch one of these viruses by consuming contaminated food or water. You may also catch a virus by sharing utensils or other personal items with an infected person or by touching a contaminated surface. SYMPTOMS  The most common symptoms are diarrhea and vomiting. These problems can cause a severe loss of body fluids (dehydration) and a body salt (electrolyte) imbalance. Other symptoms may include:  Fever.  Headache.  Fatigue.  Abdominal pain. DIAGNOSIS  Your caregiver can usually diagnose viral gastroenteritis based on your symptoms and a physical exam. A stool sample may also be taken to test for the presence of viruses or other infections. TREATMENT  This illness typically goes away on its own. Treatments are aimed at rehydration. The most serious cases of viral gastroenteritis involve vomiting so severely that you are not able to keep fluids down. In these cases, fluids must be given through an intravenous line (IV). HOME CARE INSTRUCTIONS   Drink enough fluids to keep your urine clear or pale yellow. Drink small amounts of fluids frequently and increase the amounts as tolerated.  Ask your caregiver for specific rehydration instructions.  Avoid:  Foods high in sugar.  Alcohol.  Carbonated drinks.  Tobacco.  Juice.  Caffeine drinks.  Extremely hot or cold fluids.  Fatty, greasy foods.  Too much intake of anything at one time.  Dairy products until 24 to 48 hours after diarrhea stops.  You may consume probiotics.  Probiotics are active cultures of beneficial bacteria. They may lessen the amount and number of diarrheal stools in adults. Probiotics can be found in yogurt with active cultures and in supplements.  Wash your hands well to avoid spreading the virus.  Only take over-the-counter or prescription medicines for pain, discomfort, or fever as directed by your caregiver. Do not give aspirin to children. Antidiarrheal medicines are not recommended.  Ask your caregiver if you should continue to take your regular prescribed and over-the-counter medicines.  Keep all follow-up appointments as directed by your caregiver. SEEK IMMEDIATE MEDICAL CARE IF:   You are unable to keep fluids down.  You do not urinate at least once every 6 to 8 hours.  You develop shortness of breath.  You notice blood in your stool or vomit. This may look like coffee grounds.  You have abdominal pain that increases or is concentrated in one small area (localized).  You have persistent vomiting or diarrhea.  You have a fever.  The patient is a child younger than 3 months, and he or she has a fever.  The patient is a child older than 3 months, and he or she has a fever and persistent symptoms.  The patient is a child older than 3 months, and he or she has a fever and symptoms suddenly get worse.  The patient is a baby, and he or she has no tears when crying. MAKE SURE YOU:   Understand these instructions.  Will watch your condition.  Will get help right away if you are not doing well or get worse. Document Released: 09/30/2005 Document Revised: 12/23/2011 Document Reviewed: 07/17/2011   [employer] Patient Information 2014 [employer]  

## 2013-12-31 NOTE — ED Provider Notes (Signed)
I saw and evaluated the patient, reviewed the resident's note and I agree with the findings and plan. If applicable, I agree with the resident's interpretation of the EKG.  If applicable, I was present for critical portions of any procedures performed.  Nausea, vomiting that started yesterday with chills cough, congestion, bodyaches today.  No documented fevers.  Nontoxic appearing, no meningismus, lungs clear abdomen soft but tender, worse in RUQ. Tachycardia improved with IVF, no hypoxia or tachypnea.  BP 105/70  Pulse 97  Temp(Src) 99.7 F (37.6 C) (Oral)  Resp 17  Ht 5\' 8"  (1.727 m)  Wt 275 lb (124.739 kg)  BMI 41.82 kg/m2  SpO2 100%  LMP 11/01/2011    Glynn OctaveStephen Taris Galindo, MD 12/31/13 2018

## 2013-12-31 NOTE — ED Provider Notes (Signed)
CSN: [id number]     Arrival date & time 12/31/13  0701 History   None    Chief Complaint  Patient presents with  . Chest Pain  . Cough  . Emesis     (Consider location/radiation/quality/duration/timing/severity/associated sxs/prior Treatment) Patient is a 38 y.o. female presenting with chest pain, cough, and vomiting. The history is provided by the patient. No language interpreter was used.  Chest Pain Associated symptoms: abdominal pain, back pain, cough, headache, nausea and vomiting   Associated symptoms: no fever, no palpitations and no shortness of breath   Cough Associated symptoms: chest pain, chills, headaches, rhinorrhea and sore throat   Associated symptoms: no fever, no shortness of breath and no wheezing   Emesis Associated symptoms: abdominal pain, chills, diarrhea, headaches and sore throat    This is a 38yo F w/ PMH asthma, anxiety, vertigo who presents w/ N/V x 2 days and cough, bodyaches, chills, nasal congestion that began yesterday. Patient reports N/V began Wednesday and worsened yesterday when she had about 10 episodes of NBNB emesis and one episode of nonbloody, watery diarrhea. Yesterday she also had onset of URI symptoms- ST, cough productive of green sputum, HA, nasal congestion, chills. She c/o having chest tightness that occurs only with either retching/coughing. She thinks her asthma may be acting up, but she denies SOB. She also has diffuse abd soreness that she thinks is due to coughing and retching.  Past Medical History  Diagnosis Date  . Asthma   . Anxiety   . Vertigo    Past Surgical History  Procedure Laterality Date  . Abdominal hysterectomy     No family history on file. History  Substance Use Topics  . Smoking status: Never Smoker   . Smokeless tobacco: Not on file  . Alcohol Use: Yes   OB History   Grav Para Term Preterm Abortions TAB SAB Ect Mult Living                 Review of Systems  Constitutional: Positive for chills.  Negative for fever.  HENT: Positive for congestion, rhinorrhea and sore throat.   Respiratory: Positive for cough and chest tightness. Negative for shortness of breath and wheezing.   Cardiovascular: Positive for chest pain. Negative for palpitations and leg swelling.  Gastrointestinal: Positive for nausea, vomiting, abdominal pain and diarrhea.  Genitourinary: Negative for dysuria.  Musculoskeletal: Positive for back pain.  Neurological: Positive for headaches.  All other systems reviewed and are negative.      Allergies  Aspirin  Home Medications   Current Outpatient Rx  Name  Route  Sig  Dispense  Refill  . albuterol (PROVENTIL HFA;VENTOLIN HFA) 108 (90 BASE) MCG/ACT inhaler   Inhalation   Inhale 1-2 puffs into the lungs every 6 (six) hours as needed for wheezing.   1 Inhaler   0   . DULoxetine (CYMBALTA) 60 MG capsule   Oral   Take 60 mg by mouth daily.         . furosemide (LASIX) 20 MG tablet   Oral   Take 20 mg by mouth.         Marland Kitchen HYDROcodone-acetaminophen (NORCO/VICODIN) 5-325 MG per tablet   Oral   Take 1 tablet by mouth every 6 (six) hours as needed for pain.   6 tablet   0   . loratadine (CLARITIN) 10 MG tablet   Oral   Take 10 mg by mouth daily.         . meloxicam (  MOBIC) 15 MG tablet   Oral   Take 15 mg by mouth daily.         . ondansetron (ZOFRAN) 4 MG tablet   Oral   Take 1 tablet (4 mg total) by mouth every 6 (six) hours.   12 tablet   0    BP 147/82  Pulse 112  Temp(Src) 99.7 F (37.6 C) (Oral)  Resp 22  Ht 5\' 8"  (1.727 m)  Wt 275 lb (124.739 kg)  BMI 41.82 kg/m2  SpO2 97%  LMP 11/01/2011 Physical Exam  Nursing note and vitals reviewed. Constitutional: She is oriented to person, place, and time. She appears well-developed and well-nourished. No distress.  HENT:  Head: Normocephalic and atraumatic.  Mouth/Throat: No oropharyngeal exudate.  Eyes: Conjunctivae and EOM are normal. Pupils are equal, round, and reactive to  light.  Neck: Normal range of motion. Neck supple.  Cardiovascular: Regular rhythm and normal heart sounds.   tachycardic  Pulmonary/Chest: Effort normal. No respiratory distress. She has no wheezes. She exhibits tenderness.  Faint breath sounds 2/2 body habitus; TTP over central chest wall  Abdominal: Soft. Bowel sounds are normal. She exhibits no distension. There is tenderness.  Diffuse mild TTP  Musculoskeletal: She exhibits no edema.  Neurological: She is alert and oriented to person, place, and time. No cranial nerve deficit.  Skin: Skin is warm and dry. She is not diaphoretic.    ED Course  Procedures (including critical care time) Labs Review Labs Reviewed  COMPREHENSIVE METABOLIC PANEL  CBC WITH DIFFERENTIAL   Imaging Review No results found.   EKG Interpretation   Date/Time:  Friday December 31 2013 07:26:46 EDT Ventricular Rate:  112 PR Interval:  144 QRS Duration: 76 QT Interval:  310 QTC Calculation: 423 R Axis:   70 Text Interpretation:  Sinus tachycardia Otherwise normal ECG Rate faster  Confirmed by RANCOUR  MD, STEPHEN ([id number]) on 12/31/2013 7:42:55 AM      MDM   This is a 37yo AAF w/ PMH asthma, vertigo, anxiety who presents with s/s viral syndrome. Given her productive cough, PNA is possible so will obtain CXR. Pt does have chest pain, low grade fever, and tachycardia, so PE is a consideration. However, the complete hx and lack of RFs (pt not on OCP) is more c/w viral syndrome than PE, so we will hold off on d dimer for now. Will obtain EKG, CMP, CBC. I will give her 1L NS, morphine, and albuterol to control symptoms.  10:16 AM Patient is feeling somewhat better. CMP, CBC unrevealing. CXR negative. EKG shows sinus tachycardia. Abd U/S was performed given patient c/o RUQ pain on further questioning. US significant only for small liver cysts, no cholecystitis or cholelithiasis. Patient is stable for discharge. I encouraged her her drink plenty of fluids and take  OTC decongestants and tylenol/ibuprofen for her body aches. She is no longer nauseated so won't plan to send her home with any antiemetic. She was instructed to return to the ED with any worsening of her symptoms.  Windell Hummingbirdachel Rett Stehlik, MD 12/31/13 1025

## 2013-12-31 NOTE — ED Notes (Signed)
Pt c/o chest pain, cough and vomiting x 2 days. Pt also reports general body aches and sore throat.

## 2014-04-12 ENCOUNTER — Emergency Department (HOSPITAL_COMMUNITY)
Admission: EM | Admit: 2014-04-12 | Discharge: 2014-04-12 | Disposition: A | Discharge status: Home / Self Care | Payer: Medicaid Other | Attending: Emergency Medicine | Admitting: Emergency Medicine

## 2014-04-12 ENCOUNTER — Encounter (HOSPITAL_COMMUNITY): Payer: Self-pay | Admitting: Emergency Medicine

## 2014-04-12 ENCOUNTER — Emergency Department (HOSPITAL_COMMUNITY): Payer: Medicaid Other

## 2014-04-12 DIAGNOSIS — R079 Chest pain, unspecified: Secondary | ICD-10-CM

## 2014-04-12 DIAGNOSIS — Z8659 Personal history of other mental and behavioral disorders: Secondary | ICD-10-CM | POA: Insufficient documentation

## 2014-04-12 DIAGNOSIS — J45909 Unspecified asthma, uncomplicated: Secondary | ICD-10-CM | POA: Insufficient documentation

## 2014-04-12 LAB — I-STAT TROPONIN, ED: Troponin i, poc: 0 ng/mL (ref 0.00–0.08)

## 2014-04-12 LAB — BASIC METABOLIC PANEL
BUN: 10 mg/dL (ref 6–23)
CHLORIDE: 104 meq/L (ref 96–112)
CO2: 24 mEq/L (ref 19–32)
Calcium: 9.1 mg/dL (ref 8.4–10.5)
Creatinine, Ser: 0.8 mg/dL (ref 0.50–1.10)
GFR calc non Af Amer: >90 mL/min (ref >90)
Glucose, Bld: 119 mg/dL — ABNORMAL HIGH (ref 70–99)
POTASSIUM: 4.3 meq/L (ref 3.7–5.3)
Sodium: 142 mEq/L (ref 137–147)

## 2014-04-12 LAB — CBC WITH DIFFERENTIAL/PLATELET
BASOS ABS: 0 10*3/uL (ref 0.0–0.1)
Basophils Relative: 0 % (ref 0–1)
Eosinophils Absolute: 0 10*3/uL (ref 0.0–0.7)
Eosinophils Relative: 1 % (ref 0–5)
HCT: 43.6 % (ref 36.0–46.0)
Hemoglobin: 13.9 g/dL (ref 12.0–15.0)
Lymphocytes Relative: 38 % (ref 12–46)
Lymphs Abs: 1.8 10*3/uL (ref 0.7–4.0)
MCH: 27.2 pg (ref 26.0–34.0)
MCHC: 31.9 g/dL (ref 30.0–36.0)
MCV: 85.3 fL (ref 78.0–100.0)
Monocytes Absolute: 0.4 10*3/uL (ref 0.1–1.0)
Monocytes Relative: 8 % (ref 3–12)
NEUTROS ABS: 2.5 10*3/uL (ref 1.7–7.7)
NEUTROS PCT: 53 % (ref 43–77)
PLATELETS: 287 10*3/uL (ref 150–400)
RBC: 5.11 MIL/uL (ref 3.87–5.11)
RDW: 13.7 % (ref 11.5–15.5)
WBC: 4.7 10*3/uL (ref 4.0–10.5)

## 2014-04-12 MED ORDER — HYDROCODONE-ACETAMINOPHEN 5-325 MG PO TABS: 1.0000 | ORAL_TABLET | ORAL | Status: DC | PRN

## 2014-04-12 MED ORDER — HYDROCODONE-ACETAMINOPHEN 5-325 MG PO TABS
1.0000 | ORAL_TABLET | Freq: Once | ORAL | Status: AC
  Administered 2014-04-12: 1 via ORAL
  Filled 2014-04-12: qty 1

## 2014-04-12 NOTE — Discharge Instructions (Signed)

## 2014-04-12 NOTE — ED Provider Notes (Signed)
CSN: [id number]     Arrival date & time 04/12/14  0919 History   First MD Initiated Contact with Patient 04/12/14 1023     Chief Complaint  Patient presents with  . Chest Pain     (Consider location/radiation/quality/duration/timing/severity/associated sxs/prior Treatment) Patient is a 38 y.o. female presenting with chest pain. The history is provided by the patient. No language interpreter was used.  Chest Pain Pain location:  L chest Pain quality: pressure   Pain radiates to the back: no   Associated symptoms: no abdominal pain, no cough, no fever, no shortness of breath and not vomiting   Associated symptoms comment:  Patient with a history of recurrent chest pain, anxiety, and asthma presents with chest pain that started last night and has been constant. Pain continues from the chest to the entire left side of her body. No SOB, cough or fever. She denies vomiting. No abdominal pain.   Past Medical History  Diagnosis Date  . Asthma   . Anxiety   . Vertigo    Past Surgical History  Procedure Laterality Date  . Abdominal hysterectomy     No family history on file. History  Substance Use Topics  . Smoking status: Never Smoker   . Smokeless tobacco: Not on file  . Alcohol Use: Yes   OB History   Grav Para Term Preterm Abortions TAB SAB Ect Mult Living                 Review of Systems  Constitutional: Negative for fever.  HENT: Negative for congestion.   Respiratory: Negative for cough and shortness of breath.   Cardiovascular: Positive for chest pain.  Gastrointestinal: Negative for vomiting and abdominal pain.  Genitourinary: Negative for dysuria.  Musculoskeletal: Negative for myalgias.      Allergies  Aspirin  Home Medications   Prior to Admission medications   Medication Sig Start Date End Date Taking? Authorizing Provider  acetaminophen (TYLENOL) 650 MG CR tablet Take 650 mg by mouth daily as needed for pain.   Yes Historical Provider, MD   BP 131/64   Pulse 67  Temp(Src) 98.1 F (36.7 C) (Oral)  Resp 12  Ht 5\' 8"  (1.727 m)  Wt 275 lb (124.739 kg)  BMI 41.82 kg/m2  SpO2 100%  LMP 11/01/2011 Physical Exam  Constitutional: She is oriented to person, place, and time. She appears well-developed and well-nourished.  HENT:  Head: Normocephalic.  Neck: Normal range of motion. Neck supple.  Cardiovascular: Normal rate and regular rhythm.   Pulmonary/Chest: Effort normal and breath sounds normal. She has no wheezes. She has no rales. She exhibits no tenderness.  Abdominal: Soft. Bowel sounds are normal. There is no tenderness. There is no rebound and no guarding.  Musculoskeletal: Normal range of motion. She exhibits no edema.  Neurological: She is alert and oriented to person, place, and time.  Skin: Skin is warm and dry. No rash noted.  Psychiatric: She has a normal mood and affect.    ED Course  Procedures (including critical care time) Labs Review Labs Reviewed  BASIC METABOLIC PANEL - Abnormal; Notable for the following:    Glucose, Bld 119 (*)    All other components within normal limits  CBC WITH DIFFERENTIAL  I-STAT TROPOININ, ED   Results for orders placed during the hospital encounter of 04/12/14  CBC WITH DIFFERENTIAL      Result Value Ref Range   WBC 4.7  4.0 - 10.5 K/uL   RBC 5.11  3.87 - 5.11 MIL/uL   Hemoglobin 13.9  12.0 - 15.0 g/dL   HCT 16.143.6  09.636.0 - 04.546.0 %   MCV 85.3  78.0 - 100.0 fL   MCH 27.2  26.0 - 34.0 pg   MCHC 31.9  30.0 - 36.0 g/dL   RDW 40.913.7  81.111.5 - 91.415.5 %   Platelets 287  150 - 400 K/uL   Neutrophils Relative % 53  43 - 77 %   Neutro Abs 2.5  1.7 - 7.7 K/uL   Lymphocytes Relative 38  12 - 46 %   Lymphs Abs 1.8  0.7 - 4.0 K/uL   Monocytes Relative 8  3 - 12 %   Monocytes Absolute 0.4  0.1 - 1.0 K/uL   Eosinophils Relative 1  0 - 5 %   Eosinophils Absolute 0.0  0.0 - 0.7 K/uL   Basophils Relative 0  0 - 1 %   Basophils Absolute 0.0  0.0 - 0.1 K/uL  BASIC METABOLIC PANEL      Result Value  Ref Range   Sodium 142  137 - 147 mEq/L   Potassium 4.3  3.7 - 5.3 mEq/L   Chloride 104  96 - 112 mEq/L   CO2 24  19 - 32 mEq/L   Glucose, Bld 119 (*) 70 - 99 mg/dL   BUN 10  6 - 23 mg/dL   Creatinine, Ser 7.820.80  0.50 - 1.10 mg/dL   Calcium 9.1  8.4 - 95.610.5 mg/dL   GFR calc non Af Amer >90  >90 mL/min   GFR calc Af Amer >90  >90 mL/min  I-STAT TROPOININ, ED      Result Value Ref Range   Troponin i, poc 0.00  0.00 - 0.08 ng/mL   Comment 3              Imaging Review Dg Chest 2 View  04/12/2014   CLINICAL DATA:  CHEST PAIN  EXAM: CHEST  2 VIEW  COMPARISON:  Two-view chest 12/31/2013  FINDINGS: The heart size and mediastinal contours are within normal limits. Both lungs are clear. The visualized skeletal structures are unremarkable.  IMPRESSION: No active cardiopulmonary disease.   Electronically Signed   By: Salome HolmesHector  Cooper M.D.   On: 04/12/2014 10:51     EKG Interpretation   Date/Time:  Tuesday April 12 2014 09:22:39 EDT Ventricular Rate:  66 PR Interval:  154 QRS Duration: 88 QT Interval:  392 QTC Calculation: 410 R Axis:   71 Text Interpretation:  Normal sinus rhythm Normal ECG Since last tracing  rate slower Confirmed by Ethelda ChickJACUBOWITZ  MD, SAM ([id number]) on 04/12/2014 11:10:32  AM      MDM   Final diagnoses:  None    1. Chest pain 2. History of anxiety  Labs, imaging reassuring. Negative EKG. She states she is under significant stress lately, not getting enough rest. May be contributory. Feel she is stable for discharge.     Arnoldo HookerShari A Upstill, PA-C 04/12/14 1239

## 2014-04-12 NOTE — ED Provider Notes (Signed)
Complains of left-sided chest pain onset 7:30 PM yesterday radiated to left arm worse with changing position or deep inspiration. On exam lungs clear auscultation heart regular rate and rhythm no murmurs rubs chest is exquisitely tender left side pain is reproducible by forcible abduction or flexion of left shoulder. Heart score equals 1. Pain consistent with musculoskeletal chest pain  Doug SouSam Radek Carnero, MD 04/12/14 1257

## 2014-04-12 NOTE — ED Notes (Signed)
Attempted to get labwork in triage, unable

## 2014-04-12 NOTE — ED Provider Notes (Signed)
Medical screening examination/treatment/procedure(s) were conducted as a shared visit with non-physician practitioner(s) and myself.  I personally evaluated the patient during the encounter.   EKG Interpretation   Date/Time:  Tuesday April 12 2014 09:22:39 EDT Ventricular Rate:  66 PR Interval:  154 QRS Duration: 88 QT Interval:  392 QTC Calculation: 410 R Axis:   71 Text Interpretation:  Normal sinus rhythm Normal ECG Since last tracing  rate slower Confirmed by Ethelda ChickJACUBOWITZ  MD, SAM ([id number]) on 04/12/2014 11:10:32  AM       Doug SouSam Jacubowitz, MD 04/12/14 1750

## 2014-04-12 NOTE — ED Notes (Signed)
Pt presents to department for evaluation of diffuse chest pressure radiating to L shoulder/arm. Onset yesterday evening. 6/10 pain upon arrival. Pt also states numbness/tingling to L arm. Pt is alert and oriented x4. Respirations unlabored.

## 2014-09-26 ENCOUNTER — Emergency Department (HOSPITAL_COMMUNITY): Payer: Medicaid Other

## 2014-09-26 ENCOUNTER — Emergency Department (HOSPITAL_COMMUNITY): Payer: Self-pay

## 2014-09-26 ENCOUNTER — Encounter (HOSPITAL_COMMUNITY): Payer: Self-pay | Admitting: Emergency Medicine

## 2014-09-26 ENCOUNTER — Emergency Department (HOSPITAL_COMMUNITY)
Admission: EM | Admit: 2014-09-26 | Discharge: 2014-09-26 | Disposition: A | Discharge status: Home / Self Care | Payer: Self-pay | Attending: Emergency Medicine | Admitting: Emergency Medicine

## 2014-09-26 DIAGNOSIS — M79602 Pain in left arm: Secondary | ICD-10-CM | POA: Insufficient documentation

## 2014-09-26 DIAGNOSIS — Z7982 Long term (current) use of aspirin: Secondary | ICD-10-CM | POA: Insufficient documentation

## 2014-09-26 DIAGNOSIS — F419 Anxiety disorder, unspecified: Secondary | ICD-10-CM | POA: Insufficient documentation

## 2014-09-26 DIAGNOSIS — K219 Gastro-esophageal reflux disease without esophagitis: Secondary | ICD-10-CM | POA: Insufficient documentation

## 2014-09-26 DIAGNOSIS — R519 Headache, unspecified: Secondary | ICD-10-CM

## 2014-09-26 DIAGNOSIS — R51 Headache: Secondary | ICD-10-CM | POA: Insufficient documentation

## 2014-09-26 DIAGNOSIS — J45909 Unspecified asthma, uncomplicated: Secondary | ICD-10-CM | POA: Insufficient documentation

## 2014-09-26 DIAGNOSIS — Z79899 Other long term (current) drug therapy: Secondary | ICD-10-CM | POA: Insufficient documentation

## 2014-09-26 DIAGNOSIS — R1110 Vomiting, unspecified: Secondary | ICD-10-CM | POA: Insufficient documentation

## 2014-09-26 HISTORY — DX: Gastro-esophageal reflux disease without esophagitis: K21.9

## 2014-09-26 LAB — CBC WITH DIFFERENTIAL/PLATELET
BASOS ABS: 0 10*3/uL (ref 0.0–0.1)
BASOS PCT: 0 % (ref 0–1)
EOS PCT: 2 % (ref 0–5)
Eosinophils Absolute: 0.1 10*3/uL (ref 0.0–0.7)
HEMATOCRIT: 42.6 % (ref 36.0–46.0)
Hemoglobin: 13.4 g/dL (ref 12.0–15.0)
Lymphocytes Relative: 31 % (ref 12–46)
Lymphs Abs: 1.8 10*3/uL (ref 0.7–4.0)
MCH: 26.7 pg (ref 26.0–34.0)
MCHC: 31.5 g/dL (ref 30.0–36.0)
MCV: 84.9 fL (ref 78.0–100.0)
MONO ABS: 0.5 10*3/uL (ref 0.1–1.0)
Monocytes Relative: 9 % (ref 3–12)
NEUTROS ABS: 3.4 10*3/uL (ref 1.7–7.7)
Neutrophils Relative %: 58 % (ref 43–77)
Platelets: 326 10*3/uL (ref 150–400)
RBC: 5.02 MIL/uL (ref 3.87–5.11)
RDW: 13.9 % (ref 11.5–15.5)
WBC: 5.9 10*3/uL (ref 4.0–10.5)

## 2014-09-26 LAB — I-STAT CHEM 8, ED
BUN: 8 mg/dL (ref 6–23)
CALCIUM ION: 1.12 mmol/L (ref 1.12–1.23)
CHLORIDE: 106 meq/L (ref 96–112)
Creatinine, Ser: 1 mg/dL (ref 0.50–1.10)
Glucose, Bld: 107 mg/dL — ABNORMAL HIGH (ref 70–99)
HCT: 44 % (ref 36.0–46.0)
Hemoglobin: 15 g/dL (ref 12.0–15.0)
Potassium: 4 mEq/L (ref 3.7–5.3)
Sodium: 141 mEq/L (ref 137–147)
TCO2: 24 mmol/L (ref 0–100)

## 2014-09-26 LAB — TROPONIN I: Troponin I: <0.3 ng/mL (ref <0.30)

## 2014-09-26 LAB — BASIC METABOLIC PANEL
ANION GAP: 12 (ref 5–15)
BUN: 10 mg/dL (ref 6–23)
CALCIUM: 9.2 mg/dL (ref 8.4–10.5)
CO2: 26 mEq/L (ref 19–32)
CREATININE: 0.87 mg/dL (ref 0.50–1.10)
Chloride: 101 mEq/L (ref 96–112)
GFR calc non Af Amer: 84 mL/min — ABNORMAL LOW (ref >90)
Glucose, Bld: 75 mg/dL (ref 70–99)
Potassium: 4.3 mEq/L (ref 3.7–5.3)
Sodium: 139 mEq/L (ref 137–147)

## 2014-09-26 MED ORDER — SODIUM CHLORIDE 0.9 % IV BOLUS (SEPSIS)
1000.0000 mL | INTRAVENOUS | Status: AC
  Administered 2014-09-26: 1000 mL via INTRAVENOUS

## 2014-09-26 MED ORDER — METOCLOPRAMIDE HCL 5 MG/ML IJ SOLN
10.0000 mg | Freq: Once | INTRAMUSCULAR | Status: AC
  Administered 2014-09-26: 10 mg via INTRAVENOUS
  Filled 2014-09-26: qty 2

## 2014-09-26 MED ORDER — KETOROLAC TROMETHAMINE 30 MG/ML IJ SOLN
30.0000 mg | Freq: Once | INTRAMUSCULAR | Status: AC
  Administered 2014-09-26: 30 mg via INTRAVENOUS
  Filled 2014-09-26: qty 1

## 2014-09-26 MED ORDER — HYDROMORPHONE HCL 1 MG/ML IJ SOLN
0.5000 mg | Freq: Once | INTRAMUSCULAR | Status: AC
  Administered 2014-09-26: 0.5 mg via INTRAVENOUS
  Filled 2014-09-26: qty 1

## 2014-09-26 MED ORDER — DEXAMETHASONE SODIUM PHOSPHATE 10 MG/ML IJ SOLN
10.0000 mg | Freq: Once | INTRAMUSCULAR | Status: AC
  Administered 2014-09-26: 10 mg via INTRAVENOUS
  Filled 2014-09-26: qty 1

## 2014-09-26 MED ORDER — DIPHENHYDRAMINE HCL 50 MG/ML IJ SOLN
25.0000 mg | Freq: Once | INTRAMUSCULAR | Status: AC
  Administered 2014-09-26: 25 mg via INTRAVENOUS
  Filled 2014-09-26: qty 1

## 2014-09-26 NOTE — ED Notes (Signed)
Pt made aware to return if symptoms worsen or if any life threatening symptoms occur.   

## 2014-09-26 NOTE — ED Notes (Addendum)
Pt reports dizziness, h/a.  Pt went to doctor last Monday and pt feels knot on head that has been there 6 months.  Pt was diagnosed with a cyst last Monday.  Pt states that head is spinning and has been taking OTC h/a pills with no relief. Pt also has bilateral feet swelling 1+. Pulses intact.

## 2014-09-26 NOTE — ED Provider Notes (Signed)
CSN: [id number]     Arrival date & time 09/26/14  1144 History   First MD Initiated Contact with Patient 09/26/14 1506     Chief Complaint  Patient presents with  . Headache  . Cyst     (Consider location/radiation/quality/duration/timing/severity/associated sxs/prior Treatment) Patient is a 38 y.o. female presenting with headaches. The history is provided by the patient.  Headache Pain location:  Generalized Quality:  Stabbing and dull (dull throb w/ shooting pains) Severity currently:  8/10 Severity at highest:  10/10 Onset quality:  Gradual Duration:  4 weeks Timing:  Constant Progression:  Waxing and waning Chronicity:  New Similar to prior headaches: no (does have a hx of HA's)   Context comment:  Started while combing her hair Relieved by:  Nothing Worsened by:  Sound and light Ineffective treatments:  NSAIDs Associated symptoms: dizziness and vomiting (lime green color, possibly w/ red streaks in it, she had 2 episodes last week)   Associated symptoms: no abdominal pain, no back pain, no congestion, no cough, no diarrhea, no pain, no fatigue, no fever, no nausea and no neck pain     Past Medical History  Diagnosis Date  . Asthma   . Anxiety   . Vertigo   . GERD (gastroesophageal reflux disease)    Past Surgical History  Procedure Laterality Date  . Abdominal hysterectomy     History reviewed. No pertinent family history. History  Substance Use Topics  . Smoking status: Never Smoker   . Smokeless tobacco: Not on file  . Alcohol Use: Yes   OB History    No data available     Review of Systems  Constitutional: Negative for fever and fatigue.  HENT: Negative for congestion and drooling.   Eyes: Negative for pain.  Respiratory: Negative for cough and shortness of breath.   Cardiovascular: Negative for chest pain.  Gastrointestinal: Positive for vomiting (lime green color, possibly w/ red streaks in it, she had 2 episodes last week). Negative for nausea,  abdominal pain and diarrhea.  Genitourinary: Negative for dysuria and hematuria.  Musculoskeletal: Negative for back pain, gait problem and neck pain.       Left arm aching pain for months.  Skin: Negative for color change.  Neurological: Positive for dizziness and headaches.  Hematological: Negative for adenopathy.  Psychiatric/Behavioral: Negative for behavioral problems.  All other systems reviewed and are negative.     Allergies  Aspirin  Home Medications   Prior to Admission medications   Medication Sig Start Date End Date Taking? Authorizing Provider  Aspirin-Acetaminophen-Caffeine (HEADACHE RELIEF PO) Take 1 tablet by mouth every 6 (six) hours as needed (pain).   Yes Historical Provider, MD  esomeprazole (NEXIUM) 40 MG capsule Take 40 mg by mouth daily at 12 noon.   Yes Historical Provider, MD  FLUoxetine (PROZAC) 40 MG capsule Take 40 mg by mouth daily.   Yes Historical Provider, MD   BP 125/44 mmHg  Pulse 79  Temp(Src) 98.5 F (36.9 C) (Oral)  Resp 16  Ht 5\' 8"  (1.727 m)  Wt 275 lb (124.739 kg)  BMI 41.82 kg/m2  SpO2 100%  LMP 11/01/2011 Physical Exam  Constitutional: She is oriented to person, place, and time. She appears well-developed and well-nourished.  HENT:  Head: Normocephalic and atraumatic.  Mouth/Throat: Oropharynx is clear and moist. No oropharyngeal exudate.  Mild tenderness to palpation of the left upper parietal area without obvious mass or fluctuance.  Eyes: Conjunctivae and EOM are normal. Pupils are equal, round,  and reactive to light.  Neck: Normal range of motion. Neck supple.  Cardiovascular: Normal rate, regular rhythm, normal heart sounds and intact distal pulses.  Exam reveals no gallop and no friction rub.   No murmur heard. Pulmonary/Chest: Effort normal and breath sounds normal. No respiratory distress. She has no wheezes.  Abdominal: Soft. Bowel sounds are normal. There is no tenderness. There is no rebound and no guarding.   Musculoskeletal: Normal range of motion. She exhibits no edema or tenderness.  Neurological: She is alert and oriented to person, place, and time.  alert, oriented x3 speech: normal in context and clarity memory: intact grossly cranial nerves II-XII: intact motor strength: full proximally and distally no involuntary movements or tremors sensation: intact to light touch diffusely  cerebellar: finger-to-nose and heel-to-shin intact gait: normal forwards and backwards, mild hesitancy  Skin: Skin is warm and dry.  Psychiatric: She has a normal mood and affect. Her behavior is normal.  Nursing note and vitals reviewed.   ED Course  Procedures (including critical care time) Labs Review Labs Reviewed  BASIC METABOLIC PANEL - Abnormal; Notable for the following:    GFR calc non Af Amer 84 (*)    All other components within normal limits  I-STAT CHEM 8, ED - Abnormal; Notable for the following:    Glucose, Bld 107 (*)    All other components within normal limits  CBC WITH DIFFERENTIAL  TROPONIN I    Imaging Review Dg Chest 2 View  09/26/2014   CLINICAL DATA:  Headache.  Photophobia.  Dizziness and pressure.  EXAM: CHEST  2 VIEW  COMPARISON:  04/12/2014  FINDINGS: The heart size and mediastinal contours are within normal limits. Both lungs are clear. The visualized skeletal structures are unremarkable.  IMPRESSION: No active cardiopulmonary disease.   Electronically Signed   By: Herbie Baltimore M.D.   On: 09/26/2014 17:34   Ct Head Wo Contrast  09/26/2014   CLINICAL DATA:  Headache.  Lump on the left side of the head  EXAM: CT HEAD WITHOUT CONTRAST  TECHNIQUE: Contiguous axial images were obtained from the base of the skull through the vertex without intravenous contrast.  COMPARISON:  None.  FINDINGS: The brain appears normal without hemorrhage, infarct, mass lesion, mass effect, midline shift or abnormal extra-axial fluid collection. No hydrocephalus or pneumocephalus. The calvarium  is intact. No focal lesion is identified. Imaged paranasal sinuses and mastoid air cells are clear.  IMPRESSION: Normal head CT.   Electronically Signed   By: Drusilla Kanner M.D.   On: 09/26/2014 16:28     EKG Interpretation   Date/Time:  Monday September 26 2014 12:00:16 EST Ventricular Rate:  71 PR Interval:  154 QRS Duration: 88 QT Interval:  396 QTC Calculation: 430 R Axis:   69 Text Interpretation:  Normal sinus rhythm Normal ECG No significant change  since last tracing Confirmed by Kemper Heupel  MD, Cammie Faulstich ([id number]) on 09/26/2014  4:56:20 PM      MDM   Final diagnoses:  Headache  Left arm pain    3:15 PM 38 y.o. female who presents with a left-sided headache which began approximately 3.5 weeks ago while combing her hair. She does have a history of headaches and notes her headache was gradual in onset. She denies any fevers or head trauma. She has multiple other complaints including intermittent lower extremity swelling for years and intermittent left arm aching which is been constant for several weeks. She is afebrile and vital signs are unremarkable here.  She has a normal neurologic exam. She is concerned about a bump on the left upper parietal area of her scalp which has been examined by her PCP. The patient believes this is a cyst. We'll get screening labs and imaging. Will treat with headache cocktail.  6:16 PM: HA improved. Pt continues to appear well. I interpreted/reviewed the labs and/or imaging which were non-contributory.   I have discussed the diagnosis/risks/treatment options with the patient and believe the pt to be eligible for discharge home to follow-up with her pcp. We also discussed returning to the ED immediately if new or worsening sx occur. We discussed the sx which are most concerning (e.g., worsening HA, fever) that necessitate immediate return. Medications administered to the patient during their visit and any new prescriptions provided to the patient are listed  below.  Medications given during this visit Medications  metoCLOPramide (REGLAN) injection 10 mg (10 mg Intravenous Given 09/26/14 1628)  diphenhydrAMINE (BENADRYL) injection 25 mg (25 mg Intravenous Given 09/26/14 1628)  dexamethasone (DECADRON) injection 10 mg (10 mg Intravenous Given 09/26/14 1628)  sodium chloride 0.9 % bolus 1,000 mL (1,000 mLs Intravenous New Bag/Given 09/26/14 1628)  ketorolac (TORADOL) 30 MG/ML injection 30 mg (30 mg Intravenous Given 09/26/14 1712)  HYDROmorphone (DILAUDID) injection 0.5 mg (0.5 mg Intravenous Given 09/26/14 1758)    New Prescriptions   No medications on file      Purvis SheffieldForrest Ryheem Jay, MD 09/26/14 1816

## 2014-09-26 NOTE — ED Notes (Signed)
Pt arrives POV from home with headache that began over the last several days. Pt reports light sensitivity, dizzy sensation and pressure over head. Pt also concerned for cyst on her head for the last 6 months, painful to touch. No drainage or erythema noted. Pt awake alert, oriented x4, steady gait, VSS.

## 2015-01-23 ENCOUNTER — Emergency Department (HOSPITAL_COMMUNITY)
Admission: EM | Admit: 2015-01-23 | Discharge: 2015-01-23 | Disposition: A | Discharge status: Home / Self Care | Payer: Medicaid Other | Attending: Emergency Medicine | Admitting: Emergency Medicine

## 2015-01-23 ENCOUNTER — Encounter (HOSPITAL_COMMUNITY): Payer: Self-pay | Admitting: Emergency Medicine

## 2015-01-23 DIAGNOSIS — R519 Headache, unspecified: Secondary | ICD-10-CM

## 2015-01-23 DIAGNOSIS — Z79899 Other long term (current) drug therapy: Secondary | ICD-10-CM | POA: Insufficient documentation

## 2015-01-23 DIAGNOSIS — H109 Unspecified conjunctivitis: Secondary | ICD-10-CM

## 2015-01-23 DIAGNOSIS — J45909 Unspecified asthma, uncomplicated: Secondary | ICD-10-CM | POA: Insufficient documentation

## 2015-01-23 DIAGNOSIS — H00013 Hordeolum externum right eye, unspecified eyelid: Secondary | ICD-10-CM

## 2015-01-23 DIAGNOSIS — K219 Gastro-esophageal reflux disease without esophagitis: Secondary | ICD-10-CM | POA: Insufficient documentation

## 2015-01-23 DIAGNOSIS — R51 Headache: Secondary | ICD-10-CM | POA: Insufficient documentation

## 2015-01-23 DIAGNOSIS — F419 Anxiety disorder, unspecified: Secondary | ICD-10-CM | POA: Insufficient documentation

## 2015-01-23 DIAGNOSIS — H00011 Hordeolum externum right upper eyelid: Secondary | ICD-10-CM | POA: Insufficient documentation

## 2015-01-23 LAB — CBC
HCT: 41.7 % (ref 36.0–46.0)
Hemoglobin: 13.3 g/dL (ref 12.0–15.0)
MCH: 27 pg (ref 26.0–34.0)
MCHC: 31.9 g/dL (ref 30.0–36.0)
MCV: 84.8 fL (ref 78.0–100.0)
Platelets: 468 10*3/uL — ABNORMAL HIGH (ref 150–400)
RBC: 4.92 MIL/uL (ref 3.87–5.11)
RDW: 13.1 % (ref 11.5–15.5)
WBC: 5.2 10*3/uL (ref 4.0–10.5)

## 2015-01-23 LAB — BASIC METABOLIC PANEL
Anion gap: 14 (ref 5–15)
BUN: 8 mg/dL (ref 6–23)
CO2: 22 mmol/L (ref 19–32)
Calcium: 9 mg/dL (ref 8.4–10.5)
Chloride: 103 mmol/L (ref 96–112)
Creatinine, Ser: 0.93 mg/dL (ref 0.50–1.10)
GFR calc Af Amer: 90 mL/min — ABNORMAL LOW (ref >90)
GFR calc non Af Amer: 78 mL/min — ABNORMAL LOW (ref >90)
Glucose, Bld: 96 mg/dL (ref 70–99)
Potassium: 4.8 mmol/L (ref 3.5–5.1)
Sodium: 139 mmol/L (ref 135–145)

## 2015-01-23 MED ORDER — DIPHENHYDRAMINE HCL 50 MG/ML IJ SOLN
25.0000 mg | Freq: Once | INTRAMUSCULAR | Status: AC
  Administered 2015-01-23: 25 mg via INTRAVENOUS
  Filled 2015-01-23: qty 1

## 2015-01-23 MED ORDER — CEPHALEXIN 500 MG PO CAPS: 500.0000 mg | ORAL_CAPSULE | Freq: Four times a day (QID) | ORAL | Status: DC

## 2015-01-23 MED ORDER — KETOROLAC TROMETHAMINE 15 MG/ML IJ SOLN
15.0000 mg | Freq: Once | INTRAMUSCULAR | Status: AC
  Administered 2015-01-23: 15 mg via INTRAVENOUS
  Filled 2015-01-23: qty 1

## 2015-01-23 MED ORDER — SODIUM CHLORIDE 0.9 % IV BOLUS (SEPSIS)
1000.0000 mL | Freq: Once | INTRAVENOUS | Status: AC
Start: 2015-01-23 — End: 2015-01-23
  Administered 2015-01-23: 1000 mL via INTRAVENOUS

## 2015-01-23 MED ORDER — DEXTROSE 5 % IV SOLN
1.0000 g | INTRAVENOUS | Status: DC
  Administered 2015-01-23: 1 g via INTRAVENOUS
  Filled 2015-01-23: qty 10

## 2015-01-23 MED ORDER — PROCHLORPERAZINE EDISYLATE 5 MG/ML IJ SOLN
10.0000 mg | Freq: Four times a day (QID) | INTRAMUSCULAR | Status: DC | PRN
  Administered 2015-01-23: 10 mg via INTRAVENOUS
  Filled 2015-01-23: qty 2

## 2015-01-23 MED ORDER — BUTALBITAL-APAP-CAFFEINE 50-325-40 MG PO TABS
1.0000 | ORAL_TABLET | Freq: Four times a day (QID) | ORAL | Status: DC | PRN
Start: 2015-01-23 — End: 2015-08-30

## 2015-01-23 NOTE — Discharge Instructions (Signed)
Headaches, Frequently Asked Questions °MIGRAINE HEADACHES °Q: What is migraine? What causes it? How can I treat it? °A: Generally, migraine headaches begin as a dull ache. Then they develop into a constant, throbbing, and pulsating pain. You may experience pain at the temples. You may experience pain at the front or back of one or both sides of the head. The pain is usually accompanied by a combination of: °· Nausea. °· Vomiting. °· Sensitivity to light and noise. °Some people (about 15%) experience an aura (see below) before an attack. The cause of migraine is believed to be chemical reactions in the brain. Treatment for migraine may include over-the-counter or prescription medications. It may also include self-help techniques. These include relaxation training and biofeedback.  °Q: What is an aura? °A: About 15% of people with migraine get an "aura". This is a sign of neurological symptoms that occur before a migraine headache. You may see wavy or jagged lines, dots, or flashing lights. You might experience tunnel vision or blind spots in one or both eyes. The aura can include visual or auditory hallucinations (something imagined). It may include disruptions in smell (such as strange odors), taste or touch. Other symptoms include: °· Numbness. °· A "pins and needles" sensation. °· Difficulty in recalling or speaking the correct word. °These neurological events may last as long as 60 minutes. These symptoms will fade as the headache begins. °Q: What is a trigger? °A: Certain physical or environmental factors can lead to or "trigger" a migraine. These include: °· Foods. °· Hormonal changes. °· Weather. °· Stress. °It is important to remember that triggers are different for everyone. To help prevent migraine attacks, you need to figure out which triggers affect you. Keep a headache diary. This is a good way to track triggers. The diary will help you talk to your healthcare professional about your condition. °Q: Does  weather affect migraines? °A: Bright sunshine, hot, humid conditions, and drastic changes in barometric pressure may lead to, or "trigger," a migraine attack in some people. But studies have shown that weather does not act as a trigger for everyone with migraines. °Q: What is the link between migraine and hormones? °A: Hormones start and regulate many of your body's functions. Hormones keep your body in balance within a constantly changing environment. The levels of hormones in your body are unbalanced at times. Examples are during menstruation, pregnancy, or menopause. That can lead to a migraine attack. In fact, about three quarters of all women with migraine report that their attacks are related to the menstrual cycle.  °Q: Is there an increased risk of stroke for migraine sufferers? °A: The likelihood of a migraine attack causing a stroke is very remote. That is not to say that migraine sufferers cannot have a stroke associated with their migraines. In persons under age 40, the most common associated factor for stroke is migraine headache. But over the course of a person's normal life span, the occurrence of migraine headache may actually be associated with a reduced risk of dying from cerebrovascular disease due to stroke.  °Q: What are acute medications for migraine? °A: Acute medications are used to treat the pain of the headache after it has started. Examples over-the-counter medications, NSAIDs, ergots, and triptans.  °Q: What are the triptans? °A: Triptans are the newest class of abortive medications. They are specifically targeted to treat migraine. Triptans are vasoconstrictors. They moderate some chemical reactions in the brain. The triptans work on receptors in your brain. Triptans help   to restore the balance of a neurotransmitter called serotonin. Fluctuations in levels of serotonin are thought to be a main cause of migraine.  °Q: Are over-the-counter medications for migraine effective? °A:  Over-the-counter, or "OTC," medications may be effective in relieving mild to moderate pain and associated symptoms of migraine. But you should see your caregiver before beginning any treatment regimen for migraine.  °Q: What are preventive medications for migraine? °A: Preventive medications for migraine are sometimes referred to as "prophylactic" treatments. They are used to reduce the frequency, severity, and length of migraine attacks. Examples of preventive medications include antiepileptic medications, antidepressants, beta-blockers, calcium channel blockers, and NSAIDs (nonsteroidal anti-inflammatory drugs). °Q: Why are anticonvulsants used to treat migraine? °A: During the past few years, there has been an increased interest in antiepileptic drugs for the prevention of migraine. They are sometimes referred to as "anticonvulsants". Both epilepsy and migraine may be caused by similar reactions in the brain.  °Q: Why are antidepressants used to treat migraine? °A: Antidepressants are typically used to treat people with depression. They may reduce migraine frequency by regulating chemical levels, such as serotonin, in the brain.  °Q: What alternative therapies are used to treat migraine? °A: The term "alternative therapies" is often used to describe treatments considered outside the scope of conventional Western medicine. Examples of alternative therapy include acupuncture, acupressure, and yoga. Another common alternative treatment is herbal therapy. Some herbs are believed to relieve headache pain. Always discuss alternative therapies with your caregiver before proceeding. Some herbal products contain arsenic and other toxins. °TENSION HEADACHES °Q: What is a tension-type headache? What causes it? How can I treat it? °A: Tension-type headaches occur randomly. They are often the result of temporary stress, anxiety, fatigue, or anger. Symptoms include soreness in your temples, a tightening band-like sensation  around your head (a "vice-like" ache). Symptoms can also include a pulling feeling, pressure sensations, and contracting head and neck muscles. The headache begins in your forehead, temples, or the back of your head and neck. Treatment for tension-type headache may include over-the-counter or prescription medications. Treatment may also include self-help techniques such as relaxation training and biofeedback. °CLUSTER HEADACHES °Q: What is a cluster headache? What causes it? How can I treat it? °A: Cluster headache gets its name because the attacks come in groups. The pain arrives with little, if any, warning. It is usually on one side of the head. A tearing or bloodshot eye and a runny nose on the same side of the headache may also accompany the pain. Cluster headaches are believed to be caused by chemical reactions in the brain. They have been described as the most severe and intense of any headache type. Treatment for cluster headache includes prescription medication and oxygen. °SINUS HEADACHES °Q: What is a sinus headache? What causes it? How can I treat it? °A: When a cavity in the bones of the face and skull (a sinus) becomes inflamed, the inflammation will cause localized pain. This condition is usually the result of an allergic reaction, a tumor, or an infection. If your headache is caused by a sinus blockage, such as an infection, you will probably have a fever. An x-ray will confirm a sinus blockage. Your caregiver's treatment might include antibiotics for the infection, as well as antihistamines or decongestants.  °REBOUND HEADACHES °Q: What is a rebound headache? What causes it? How can I treat it? °A: A pattern of taking acute headache medications too often can lead to a condition known as "rebound headache."   A pattern of taking too much headache medication includes taking it more than 2 days per week or in excessive amounts. That means more than the label or a caregiver advises. With rebound  headaches, your medications not only stop relieving pain, they actually begin to cause headaches. Doctors treat rebound headache by tapering the medication that is being overused. Sometimes your caregiver will gradually substitute a different type of treatment or medication. Stopping may be a challenge. Regularly overusing a medication increases the potential for serious side effects. Consult a caregiver if you regularly use headache medications more than 2 days per week or more than the label advises. ADDITIONAL QUESTIONS AND ANSWERS Q: What is biofeedback? A: Biofeedback is a self-help treatment. Biofeedback uses special equipment to monitor your body's involuntary physical responses. Biofeedback monitors:  Breathing.  Pulse.  Heart rate.  Temperature.  Muscle tension.  Brain activity. Biofeedback helps you refine and perfect your relaxation exercises. You learn to control the physical responses that are related to stress. Once the technique has been mastered, you do not need the equipment any more. Q: Are headaches hereditary? A: Four out of five (80%) of people that suffer report a family history of migraine. Scientists are not sure if this is genetic or a family predisposition. Despite the uncertainty, a child has a 50% chance of having migraine if one parent suffers. The child has a 75% chance if both parents suffer.  Q: Can children get headaches? A: By the time they reach high school, most young people have experienced some type of headache. Many safe and effective approaches or medications can prevent a headache from occurring or stop it after it has begun.  Q: What type of doctor should I see to diagnose and treat my headache? A: Start with your primary caregiver. Discuss his or her experience and approach to headaches. Discuss methods of classification, diagnosis, and treatment. Your caregiver may decide to recommend you to a headache specialist, depending upon your symptoms or other  physical conditions. Having diabetes, allergies, etc., may require a more comprehensive and inclusive approach to your headache. The National Headache Foundation will provide, upon request, a list of [facility] physician members in your state. Document Released: 12/21/2003 Document Revised: 12/23/2011 Document Reviewed: 05/30/2008 [facility] Patient Information 2015 [place]. This information is not intended to replace advice given to you by your health care provider. Make sure you discuss any questions you have with your health care provider.  Sty A sty (hordeolum) is an infection of a gland in the eyelid located at the base of the eyelash. A sty may develop a white or yellow head of pus. It can be puffy (swollen). Usually, the sty will burst and pus will come out on its own. They do not leave lumps in the eyelid once they drain. A sty is often confused with another form of cyst of the eyelid called a chalazion. Chalazions occur within the eyelid and not on the edge where the bases of the eyelashes are. They often are red, sore and then form firm lumps in the eyelid. CAUSES   Germs (bacteria).  Lasting (chronic) eyelid inflammation. SYMPTOMS   Tenderness, redness and swelling along the edge of the eyelid at the base of the eyelashes.  Sometimes, there is a white or yellow head of pus. It may or may not drain. DIAGNOSIS  An ophthalmologist will be able to distinguish between a sty and a chalazion and treat the condition appropriately.  TREATMENT   Styes are  typically treated with warm packs (compresses) until drainage occurs.  In rare cases, medicines that kill germs (antibiotics) may be prescribed. These antibiotics may be in the form of drops, cream or pills.  If a hard lump has formed, it is generally necessary to do a small incision and remove the hardened contents of the cyst in a minor surgical procedure done in the office.  In suspicious cases, your caregiver may send the contents of  the cyst to the lab to be certain that it is not a rare, but dangerous form of cancer of the glands of the eyelid. HOME CARE INSTRUCTIONS   Wash your hands often and dry them with a clean towel. Avoid touching your eyelid. This may spread the infection to other parts of the eye.  Apply heat to your eyelid for 10 to 20 minutes, several times a day, to ease pain and help to heal it faster.  Do not squeeze the sty. Allow it to drain on its own. Wash your eyelid carefully 3 to 4 times per day to remove any pus. SEEK IMMEDIATE MEDICAL CARE IF:   Your eye becomes painful or puffy (swollen).  Your vision changes.  Your sty does not drain by itself within 3 days.  Your sty comes back within a short period of time, even with treatment.  You have redness (inflammation) around the eye.  You have a fever. Document Released: 07/10/2005 Document Revised: 12/23/2011 Document Reviewed: 01/14/2014 [facility] Patient Information 2015 [place], [place]. This information is not intended to replace advice given to you by your health care provider. Make sure you discuss any questions you have with your health care provider.

## 2015-01-23 NOTE — ED Notes (Signed)
Pt c/o headache, dizziness, and sty to right eye ongoing for about 1 week. Pt with nausea and photosensitivity

## 2015-01-28 NOTE — ED Provider Notes (Signed)
CSN: [id number]     Arrival date & time 01/23/15  1028 History   First MD Initiated Contact with Patient 01/23/15 1100     Chief Complaint  Patient presents with  . Dizziness  . Headache  . Eye Problem     (Consider location/radiation/quality/duration/timing/severity/associated sxs/prior Treatment) HPI   39 year old female with right eye pain and headache. Patient with a painful red lesion to her right upper eyelid which began about a week ago. It has been persistent since. Tender to the touch. No drainage. She's been putting ice packs on it. Associated with a diffused headache. Mild nausea. Photophobia both eyes. No fevers or chills. Denies any trauma. No acute numbness, tingling or loss of strength.  Past Medical History  Diagnosis Date  . Asthma   . Anxiety   . Vertigo   . GERD (gastroesophageal reflux disease)    Past Surgical History  Procedure Laterality Date  . Abdominal hysterectomy     No family history on file. History  Substance Use Topics  . Smoking status: Never Smoker   . Smokeless tobacco: Not on file  . Alcohol Use: Yes   OB History    No data available     Review of Systems  All systems reviewed and negative, other than as noted in HPI.   Allergies  Aspirin  Home Medications   Prior to Admission medications   Medication Sig Start Date End Date Taking? Authorizing Provider  DM-Phenylephrine-Acetaminophen 10-5-325 MG TABS Take 1 tablet by mouth every 6 (six) hours as needed (cold/cough).   Yes Historical Provider, MD  Aspirin-Acetaminophen-Caffeine (HEADACHE RELIEF PO) Take 1 tablet by mouth every 6 (six) hours as needed (pain).    Historical Provider, MD  butalbital-acetaminophen-caffeine (FIORICET) [phone] MG per tablet Take 1-2 tablets by mouth every 6 (six) hours as needed for headache. 01/23/15 01/23/16  Raeford Razor, MD  cephALEXin (KEFLEX) 500 MG capsule Take 1 capsule (500 mg total) by mouth 4 (four) times daily. 01/23/15   Raeford Razor, MD   esomeprazole (NEXIUM) 40 MG capsule Take 40 mg by mouth daily at 12 noon.    Historical Provider, MD  FLUoxetine (PROZAC) 40 MG capsule Take 40 mg by mouth daily.    Historical Provider, MD   BP 140/76 mmHg  Pulse 71  Temp(Src) 98.1 F (36.7 C) (Oral)  Resp 16  Ht  (1.702 m)  Wt 262 lb 4 oz (118.956 kg)  BMI 41.06 kg/m2  SpO2 100%  LMP 11/01/2011 Physical Exam  Constitutional: She is oriented to person, place, and time. She appears well-developed and well-nourished. No distress.  HENT:  Head: Normocephalic and atraumatic.  Eyes: Conjunctivae are normal. Right eye exhibits no discharge. Left eye exhibits no discharge. No scleral icterus.    Raised, erythematous, tender lesion in the depicted area. Consistent with a sty. Faint erythema extending laterally across the upper lid superiorly towards the eyebrow. No drainage. Mild conjunctival injection. No discharge. Anterior chambers clear. Pupils are equal round reactive to light. Extraocular muscle function is normal.  Neck: Neck supple.  No nuchal rigidity  Cardiovascular: Normal rate, regular rhythm and normal heart sounds.  Exam reveals no gallop and no friction rub.   No murmur heard. Pulmonary/Chest: Effort normal and breath sounds normal. No respiratory distress.  Abdominal: Soft. She exhibits no distension. There is no tenderness.  Musculoskeletal: She exhibits no edema or tenderness.  Neurological: She is alert and oriented to person, place, and time. No cranial nerve deficit. She exhibits normal  muscle tone. Coordination normal.  Skin: Skin is warm and dry.  Psychiatric: She has a normal mood and affect. Her behavior is normal. Thought content normal.  Nursing note and vitals reviewed.   ED Course  Procedures (including critical care time) Labs Review Labs Reviewed  CBC - Abnormal; Notable for the following:    Platelets 468 (*)    All other components within normal limits  BASIC METABOLIC PANEL - Abnormal;  Notable for the following:    GFR calc non Af Amer 78 (*)    GFR calc Af Amer 90 (*)    All other components within normal limits    Imaging Review No results found.   EKG Interpretation None      MDM   Final diagnoses:  Conjunctivitis of right eye  Sty, external, right  Acute nonintractable headache, unspecified headache type    39 year old female with a sty upper lid right eye. She does have some surrounding erythema. I suspect this is actually from patient constantly rubbing the area but cannot rule out mild cellulitis. She'll be placed on antibiotics for this reason. Warm compresses. Eye exam is otherwise fairly unremarkable. She is nonfocal neurological examination. It has been determined that no acute conditions requiring further emergency intervention are present at this time. The patient has been advised of the diagnosis and plan. I reviewed any labs and imaging including any potential incidental findings. We have discussed signs and symptoms that warrant return to the ED and they are listed in the discharge instructions.    Raeford RazorStephen Gillian Kluever, MD 01/28/15 1800

## 2015-01-31 NOTE — Op Note (Signed)
**Note Danielle-Identified via Obfuscation** PATIENT NAME:  Danielle Pitts, Danielle Pitts MR#:  [id number] DATE OF BIRTH:  Dec 10, 1975  DATE OF PROCEDURE:  05/22/2012  PREOPERATIVE DIAGNOSES:  1. Menorrhagia. 2. Uterine fibroids.   POSTOPERATIVE DIAGNOSES:  1. Menorrhagia. 2. Uterine fibroids.   PROCEDURES:  1. Total laparoscopic hysterectomy.  2. Cystoscopy.   ANESTHESIA: General.   SURGEON: Rivers Hamrick A. Patton SallesWeaver-Lee, MD   ASSISTANT: Deloris PingPhilip J. Rosenow, MD    ESTIMATED BLOOD LOSS: 50 mL.  OPERATIVE FLUIDS: 1 liter.   COMPLICATIONS: None.   FINDINGS: Enlarged fibroid uterus with a large posterior pedunculated fibroid, normal appearing tubes and ovaries.   SPECIMENS: Uterus with cervix.   INDICATIONS: The patient is a 39 year old who presents with menorrhagia and finding of uterine fibroids. The patient desired definitive surgical management. Risks, benefits, indications, and alternatives of the procedure were explained and informed consent was obtained.   PROCEDURE: The patient was taken to the operating room with IV fluids running. She was prepped and draped in the usual sterile fashion in [place] stirrups. A speculum was placed inside the vagina. The anterior lip of the cervix was grasped with a single-tooth tenaculum and a V-care uterine manipulator was placed. Attention was turned to the patient's abdomen where the infraumbilical region was injected with 0.5% Sensorcaine. An 11 mm infraumbilical incision was made and the Veress needle was placed. The abdomen was insufflated with CO2 gas. The Veress needle was removed and the abdomen was entered under direct visualization using a # 11 mm Xcel trocar. Two additional trocars were placed in the patient's abdomen bilaterally and findings were as previously stated. The left uterine cornu was grasped. The tube and uteroovarian ligaments were cauterized with Kleppinger and transected using the harmonic scalpel. The round ligament was likewise cauterized and transected. The remainder of the broad  ligament was subsequently cauterized with the Kleppinger and transected. The bladder flap was created on the patient's left side. This was repeated on the patient's right side. The cardinal ligaments were then cauterized with the Kleppinger carefully and then transected using the harmonic scalpel. The V-care manipulator cuff was located and the colpotomy was made using the V-care cup as a guide. The V-care manipulator was removed. The cervix was grasped with a single-tooth tenaculum and the uterus was removed from the vagina with some difficulty because it was enlarged and also contained a large pedunculated fibroid. The vaginal cuff was closed using an EndoStitch apparatus in an interrupted fashion. The patient had been given indigo carmine dye toward the end of the procedure. Cystoscopy was performed and dye was seen to spill from both ureteral orifices indicating ureteral patency. Attention was turned back to the patient's abdomen where the fascia of the umbilical incision was closed with a #2-0 Vicryl and the skin of all incisions were closed with a #4-0 Vicryl in a subcuticular fashion.   The patient tolerated the procedure well. Sponge, needle, and instrument counts were correct x2. The patient was awakened from anesthesia and taken to the recovery room in stable condition.   ____________________________ Sonda PrimesLashawn A. Patton SallesWeaver-Lee, MD law:drc D: 05/22/2012 10:09:56 ET T: 05/22/2012 10:43:00 ET JOB#: [id number]  cc: Flint MelterLashawn A. Patton SallesWeaver-Lee, MD, <Dictator> Sonda PrimesLASHAWN A WEAVER LEE MD ELECTRONICALLY SIGNED 05/29/2012 10:29

## 2015-06-29 ENCOUNTER — Encounter (HOSPITAL_COMMUNITY): Payer: Self-pay | Admitting: Emergency Medicine

## 2015-06-29 ENCOUNTER — Emergency Department (HOSPITAL_COMMUNITY)
Admission: EM | Admit: 2015-06-29 | Discharge: 2015-06-29 | Disposition: A | Discharge status: Home / Self Care | Payer: Medicaid Other | Attending: Emergency Medicine | Admitting: Emergency Medicine

## 2015-06-29 DIAGNOSIS — Z8719 Personal history of other diseases of the digestive system: Secondary | ICD-10-CM | POA: Insufficient documentation

## 2015-06-29 DIAGNOSIS — R51 Headache: Secondary | ICD-10-CM | POA: Insufficient documentation

## 2015-06-29 DIAGNOSIS — R112 Nausea with vomiting, unspecified: Secondary | ICD-10-CM | POA: Insufficient documentation

## 2015-06-29 DIAGNOSIS — R220 Localized swelling, mass and lump, head: Secondary | ICD-10-CM | POA: Insufficient documentation

## 2015-06-29 DIAGNOSIS — Z8659 Personal history of other mental and behavioral disorders: Secondary | ICD-10-CM | POA: Insufficient documentation

## 2015-06-29 DIAGNOSIS — R42 Dizziness and giddiness: Secondary | ICD-10-CM | POA: Insufficient documentation

## 2015-06-29 DIAGNOSIS — R519 Headache, unspecified: Secondary | ICD-10-CM

## 2015-06-29 DIAGNOSIS — R0981 Nasal congestion: Secondary | ICD-10-CM | POA: Insufficient documentation

## 2015-06-29 DIAGNOSIS — H53149 Visual discomfort, unspecified: Secondary | ICD-10-CM | POA: Insufficient documentation

## 2015-06-29 DIAGNOSIS — J45909 Unspecified asthma, uncomplicated: Secondary | ICD-10-CM | POA: Insufficient documentation

## 2015-06-29 MED ORDER — DEXAMETHASONE SODIUM PHOSPHATE 10 MG/ML IJ SOLN
10.0000 mg | Freq: Once | INTRAMUSCULAR | Status: AC
  Administered 2015-06-29: 10 mg via INTRAVENOUS
  Filled 2015-06-29: qty 1

## 2015-06-29 MED ORDER — METOCLOPRAMIDE HCL 5 MG/ML IJ SOLN
10.0000 mg | Freq: Once | INTRAMUSCULAR | Status: AC
  Administered 2015-06-29: 10 mg via INTRAVENOUS
  Filled 2015-06-29: qty 2

## 2015-06-29 MED ORDER — SODIUM CHLORIDE 0.9 % IV BOLUS (SEPSIS)
1000.0000 mL | Freq: Once | INTRAVENOUS | Status: AC
  Administered 2015-06-29: 1000 mL via INTRAVENOUS

## 2015-06-29 MED ORDER — ONDANSETRON HCL 4 MG PO TABS: 4.0000 mg | ORAL_TABLET | Freq: Four times a day (QID) | ORAL | Status: DC

## 2015-06-29 MED ORDER — DIPHENHYDRAMINE HCL 50 MG/ML IJ SOLN
25.0000 mg | Freq: Once | INTRAMUSCULAR | Status: AC
  Administered 2015-06-29: 25 mg via INTRAVENOUS
  Filled 2015-06-29: qty 1

## 2015-06-29 NOTE — Discharge Instructions (Signed)
Migraine Headache A migraine headache is an intense, throbbing pain on one or both sides of your head. A migraine can last for 30 minutes to several hours. CAUSES  The exact cause of a migraine headache is not always known. However, a migraine may be caused when nerves in the brain become irritated and release chemicals that cause inflammation. This causes pain. Certain things may also trigger migraines, such as:  Alcohol.  Smoking.  Stress.  Menstruation.  Aged cheeses.  Foods or drinks that contain nitrates, glutamate, aspartame, or tyramine.  Lack of sleep.  Chocolate.  Caffeine.  Hunger.  Physical exertion.  Fatigue.  Medicines used to treat chest pain (nitroglycerine), birth control pills, estrogen, and some blood pressure medicines. SIGNS AND SYMPTOMS  Pain on one or both sides of your head.  Pulsating or throbbing pain.  Severe pain that prevents daily activities.  Pain that is aggravated by any physical activity.  Nausea, vomiting, or both.  Dizziness.  Pain with exposure to bright lights, loud noises, or activity.  General sensitivity to bright lights, loud noises, or smells. Before you get a migraine, you may get warning signs that a migraine is coming (aura). An aura may include:  Seeing flashing lights.  Seeing bright spots, halos, or zigzag lines.  Having tunnel vision or blurred vision.  Having feelings of numbness or tingling.  Having trouble talking.  Having muscle weakness. DIAGNOSIS  A migraine headache is often diagnosed based on:  Symptoms.  Physical exam.  A CT scan or MRI of your head. These imaging tests cannot diagnose migraines, but they can help rule out other causes of headaches. TREATMENT Medicines may be given for pain and nausea. Medicines can also be given to help prevent recurrent migraines.  HOME CARE INSTRUCTIONS  Only take over-the-counter or prescription medicines for pain or discomfort as directed by your  health care provider. The use of long-term narcotics is not recommended.  Lie down in a dark, quiet room when you have a migraine.  Keep a journal to find out what may trigger your migraine headaches. For example, write down:  What you eat and drink.  How much sleep you get.  Any change to your diet or medicines.  Limit alcohol consumption.  Quit smoking if you smoke.  Get 7-9 hours of sleep, or as recommended by your health care provider.  Limit stress.  Keep lights dim if bright lights bother you and make your migraines worse. SEEK IMMEDIATE MEDICAL CARE IF:   Your migraine becomes severe.  You have a fever.  You have a stiff neck.  You have vision loss.  You have muscular weakness or loss of muscle control.  You start losing your balance or have trouble walking.  You feel faint or pass out.  You have severe symptoms that are different from your first symptoms. MAKE SURE YOU:   Understand these instructions.  Will watch your condition.  Will get help right away if you are not doing well or get worse. Document Released: 09/30/2005 Document Revised: 02/14/2014 Document Reviewed: 06/07/2013 [facility] Patient Information 2015 [place], [place]. This information is not intended to replace advice given to you by your health care provider. Make sure you discuss any questions you have with your health care provider.  General Headache Without Cause A headache is pain or discomfort felt around the head or neck area. The specific cause of a headache may not be found. There are many causes and types of headaches. A few common ones  are:  Tension headaches.  Migraine headaches.  Cluster headaches.  Chronic daily headaches. HOME CARE INSTRUCTIONS   Keep all follow-up appointments with your caregiver or any specialist referral.  Only take over-the-counter or prescription medicines for pain or discomfort as directed by your caregiver.  Lie down in a dark, quiet room when  you have a headache.  Keep a headache journal to find out what may trigger your migraine headaches. For example, write down:  What you eat and drink.  How much sleep you get.  Any change to your diet or medicines.  Try massage or other relaxation techniques.  Put ice packs or heat on the head and neck. Use these 3 to 4 times per day for 15 to 20 minutes each time, or as needed.  Limit stress.  Sit up straight, and do not tense your muscles.  Quit smoking if you smoke.  Limit alcohol use.  Decrease the amount of caffeine you drink, or stop drinking caffeine.  Eat and sleep on a regular schedule.  Get 7 to 9 hours of sleep, or as recommended by your caregiver.  Keep lights dim if bright lights bother you and make your headaches worse. SEEK MEDICAL CARE IF:   You have problems with the medicines you were prescribed.  Your medicines are not working.  You have a change from the usual headache.  You have nausea or vomiting. SEEK IMMEDIATE MEDICAL CARE IF:   Your headache becomes severe.  You have a fever.  You have a stiff neck.  You have loss of vision.  You have muscular weakness or loss of muscle control.  You start losing your balance or have trouble walking.  You feel faint or pass out.  You have severe symptoms that are different from your first symptoms. MAKE SURE YOU:   Understand these instructions.  Will watch your condition.  Will get help right away if you are not doing well or get worse. Document Released: 09/30/2005 Document Revised: 12/23/2011 Document Reviewed: 10/16/2011 [facility] Patient Information 2015 [place], [place]. This information is not intended to replace advice given to you by your health care provider. Make sure you discuss any questions you have with your health care provider.  Headaches, Frequently Asked Questions MIGRAINE HEADACHES Q: What is migraine? What causes it? How can I treat it? A: Generally, migraine headaches  begin as a dull ache. Then they develop into a constant, throbbing, and pulsating pain. You may experience pain at the temples. You may experience pain at the front or back of one or both sides of the head. The pain is usually accompanied by a combination of:  Nausea.  Vomiting.  Sensitivity to light and noise. Some people (about 15%) experience an aura (see below) before an attack. The cause of migraine is believed to be chemical reactions in the brain. Treatment for migraine may include over-the-counter or prescription medications. It may also include self-help techniques. These include relaxation training and biofeedback.  Q: What is an aura? A: About 15% of people with migraine get an "aura". This is a sign of neurological symptoms that occur before a migraine headache. You may see wavy or jagged lines, dots, or flashing lights. You might experience tunnel vision or blind spots in one or both eyes. The aura can include visual or auditory hallucinations (something imagined). It may include disruptions in smell (such as strange odors), taste or touch. Other symptoms include:  Numbness.  A "pins and needles" sensation.  Difficulty in recalling or  speaking the correct word. These neurological events may last as long as 60 minutes. These symptoms will fade as the headache begins. Q: What is a trigger? A: Certain physical or environmental factors can lead to or "trigger" a migraine. These include:  Foods.  Hormonal changes.  Weather.  Stress. It is important to remember that triggers are different for everyone. To help prevent migraine attacks, you need to figure out which triggers affect you. Keep a headache diary. This is a good way to track triggers. The diary will help you talk to your healthcare professional about your condition. Q: Does weather affect migraines? A: Bright sunshine, hot, humid conditions, and drastic changes in barometric pressure may lead to, or "trigger," a migraine  attack in some people. But studies have shown that weather does not act as a trigger for everyone with migraines. Q: What is the link between migraine and hormones? A: Hormones start and regulate many of your body's functions. Hormones keep your body in balance within a constantly changing environment. The levels of hormones in your body are unbalanced at times. Examples are during menstruation, pregnancy, or menopause. That can lead to a migraine attack. In fact, about three quarters of all women with migraine report that their attacks are related to the menstrual cycle.  Q: Is there an increased risk of stroke for migraine sufferers? A: The likelihood of a migraine attack causing a stroke is very remote. That is not to say that migraine sufferers cannot have a stroke associated with their migraines. In persons under age 38, the most common associated factor for stroke is migraine headache. But over the course of a person's normal life span, the occurrence of migraine headache may actually be associated with a reduced risk of dying from cerebrovascular disease due to stroke.  Q: What are acute medications for migraine? A: Acute medications are used to treat the pain of the headache after it has started. Examples over-the-counter medications, NSAIDs, ergots, and triptans.  Q: What are the triptans? A: Triptans are the newest class of abortive medications. They are specifically targeted to treat migraine. Triptans are vasoconstrictors. They moderate some chemical reactions in the brain. The triptans work on receptors in your brain. Triptans help to restore the balance of a neurotransmitter called serotonin. Fluctuations in levels of serotonin are thought to be a main cause of migraine.  Q: Are over-the-counter medications for migraine effective? A: Over-the-counter, or "OTC," medications may be effective in relieving mild to moderate pain and associated symptoms of migraine. But you should see your  caregiver before beginning any treatment regimen for migraine.  Q: What are preventive medications for migraine? A: Preventive medications for migraine are sometimes referred to as "prophylactic" treatments. They are used to reduce the frequency, severity, and length of migraine attacks. Examples of preventive medications include antiepileptic medications, antidepressants, beta-blockers, calcium channel blockers, and NSAIDs (nonsteroidal anti-inflammatory drugs). Q: Why are anticonvulsants used to treat migraine? A: During the past few years, there has been an increased interest in antiepileptic drugs for the prevention of migraine. They are sometimes referred to as "anticonvulsants". Both epilepsy and migraine may be caused by similar reactions in the brain.  Q: Why are antidepressants used to treat migraine? A: Antidepressants are typically used to treat people with depression. They may reduce migraine frequency by regulating chemical levels, such as serotonin, in the brain.  Q: What alternative therapies are used to treat migraine? A: The term "alternative therapies" is often used to describe treatments considered  outside the scope of conventional Western medicine. Examples of alternative therapy include acupuncture, acupressure, and yoga. Another common alternative treatment is herbal therapy. Some herbs are believed to relieve headache pain. Always discuss alternative therapies with your caregiver before proceeding. Some herbal products contain arsenic and other toxins. TENSION HEADACHES Q: What is a tension-type headache? What causes it? How can I treat it? A: Tension-type headaches occur randomly. They are often the result of temporary stress, anxiety, fatigue, or anger. Symptoms include soreness in your temples, a tightening band-like sensation around your head (a "vice-like" ache). Symptoms can also include a pulling feeling, pressure sensations, and contracting head and neck muscles. The  headache begins in your forehead, temples, or the back of your head and neck. Treatment for tension-type headache may include over-the-counter or prescription medications. Treatment may also include self-help techniques such as relaxation training and biofeedback. CLUSTER HEADACHES Q: What is a cluster headache? What causes it? How can I treat it? A: Cluster headache gets its name because the attacks come in groups. The pain arrives with little, if any, warning. It is usually on one side of the head. A tearing or bloodshot eye and a runny nose on the same side of the headache may also accompany the pain. Cluster headaches are believed to be caused by chemical reactions in the brain. They have been described as the most severe and intense of any headache type. Treatment for cluster headache includes prescription medication and oxygen. SINUS HEADACHES Q: What is a sinus headache? What causes it? How can I treat it? A: When a cavity in the bones of the face and skull (a sinus) becomes inflamed, the inflammation will cause localized pain. This condition is usually the result of an allergic reaction, a tumor, or an infection. If your headache is caused by a sinus blockage, such as an infection, you will probably have a fever. An x-ray will confirm a sinus blockage. Your caregiver's treatment might include antibiotics for the infection, as well as antihistamines or decongestants.  REBOUND HEADACHES Q: What is a rebound headache? What causes it? How can I treat it? A: A pattern of taking acute headache medications too often can lead to a condition known as "rebound headache." A pattern of taking too much headache medication includes taking it more than 2 days per week or in excessive amounts. That means more than the label or a caregiver advises. With rebound headaches, your medications not only stop relieving pain, they actually begin to cause headaches. Doctors treat rebound headache by tapering the medication  that is being overused. Sometimes your caregiver will gradually substitute a different type of treatment or medication. Stopping may be a challenge. Regularly overusing a medication increases the potential for serious side effects. Consult a caregiver if you regularly use headache medications more than 2 days per week or more than the label advises. ADDITIONAL QUESTIONS AND ANSWERS Q: What is biofeedback? A: Biofeedback is a self-help treatment. Biofeedback uses special equipment to monitor your body's involuntary physical responses. Biofeedback monitors:  Breathing.  Pulse.  Heart rate.  Temperature.  Muscle tension.  Brain activity. Biofeedback helps you refine and perfect your relaxation exercises. You learn to control the physical responses that are related to stress. Once the technique has been mastered, you do not need the equipment any more. Q: Are headaches hereditary? A: Four out of five (80%) of people that suffer report a family history of migraine. Scientists are not sure if this is genetic or a family  predisposition. Despite the uncertainty, a child has a 50% chance of having migraine if one parent suffers. The child has a 75% chance if both parents suffer.  Q: Can children get headaches? A: By the time they reach high school, most young people have experienced some type of headache. Many safe and effective approaches or medications can prevent a headache from occurring or stop it after it has begun.  Q: What type of doctor should I see to diagnose and treat my headache? A: Start with your primary caregiver. Discuss his or her experience and approach to headaches. Discuss methods of classification, diagnosis, and treatment. Your caregiver may decide to recommend you to a headache specialist, depending upon your symptoms or other physical conditions. Having diabetes, allergies, etc., may require a more comprehensive and inclusive approach to your headache. The National Headache  Foundation will provide, upon request, a list of [facility] physician members in your state. Document Released: 12/21/2003 Document Revised: 12/23/2011 Document Reviewed: 05/30/2008 [facility] Patient Information 2015 [place], [place]. This information is not intended to replace advice given to you by your health care provider. Make sure you discuss any questions you have with your health care provider.

## 2015-06-29 NOTE — ED Provider Notes (Signed)
CSN: [id number]     Arrival date & time 06/29/15  0534 History   First MD Initiated Contact with Patient 06/29/15 0601     Chief Complaint  Patient presents with  . Headache   HPI  Danielle Pitts is a 39 year old female with PMHx of headaches, vertigo and anxiety presenting with a headache. Pt states headache began 4 days ago. Pain came on gradually, is frontal and not relieved with tylenol. Described as throbbing. Pt endorses phonophobia, photophobia, dizziness, nausea and vomiting. Denies recent head trauma. Pt states that her right side of her face feels swollen with sinus pressure and she has felt congested recently. Pt reports a history of migraines and states this feels like her past episodes with similar symptoms . Denies fevers, chills, syncope, sore throat, rhinorrhea, chest pain, SOB, abdominal pain, numbness or weakness in the extremities.    Past Medical History  Diagnosis Date  . Asthma   . Anxiety   . Vertigo   . GERD (gastroesophageal reflux disease)    Past Surgical History  Procedure Laterality Date  . Abdominal hysterectomy    . Laproscopy     Family History  Problem Relation Age of Onset  . Hypertension Other   . Diabetes Other   . Cancer Other    Social History  Substance Use Topics  . Smoking status: Never Smoker   . Smokeless tobacco: None  . Alcohol Use: Yes   OB History    No data available     Review of Systems  Constitutional: Negative for fever and chills.  HENT: Positive for congestion, facial swelling and sinus pressure. Negative for rhinorrhea and sore throat.   Eyes: Positive for photophobia. Negative for visual disturbance.  Respiratory: Negative for shortness of breath.   Cardiovascular: Negative for chest pain.  Gastrointestinal: Positive for nausea and vomiting. Negative for abdominal pain.  Musculoskeletal: Negative for neck pain.  Neurological: Positive for dizziness and headaches. Negative for syncope, weakness and numbness.       Allergies  Aspirin  Home Medications   Prior to Admission medications   Medication Sig Start Date End Date Taking? Authorizing Provider  acetaminophen (TYLENOL) 500 MG tablet Take 1,000 mg by mouth every 6 (six) hours as needed for headache.   Yes Historical Provider, MD  butalbital-acetaminophen-caffeine (FIORICET) 50-325-40 MG per tablet Take 1-2 tablets by mouth every 6 (six) hours as needed for headache. Patient not taking: Reported on 06/29/2015 01/23/15 01/23/16  Raeford Razor, MD  cephALEXin (KEFLEX) 500 MG capsule Take 1 capsule (500 mg total) by mouth 4 (four) times daily. Patient not taking: Reported on 06/29/2015 01/23/15   Raeford Razor, MD   BP 147/94 mmHg  Pulse 73  Temp(Src) 97.9 F (36.6 C) (Oral)  Resp 17  SpO2 100%  LMP 11/01/2011 Physical Exam  Constitutional: She is oriented to person, place, and time. She appears well-developed and well-nourished. No distress.  HENT:  Head: Normocephalic and atraumatic.  Nose: Mucosal edema present. Right sinus exhibits maxillary sinus tenderness and frontal sinus tenderness. Left sinus exhibits no maxillary sinus tenderness and no frontal sinus tenderness.  Mouth/Throat: Oropharynx is clear and moist. No oropharyngeal exudate.  Eyes: Conjunctivae and EOM are normal. Pupils are equal, round, and reactive to light. Right eye exhibits no discharge. Left eye exhibits no discharge. No scleral icterus.  Neck: Normal range of motion.  Cardiovascular: Normal rate and regular rhythm.   Pulmonary/Chest: Effort normal and breath sounds normal. No respiratory distress. She has no  wheezes. She has no rales.  Abdominal: Soft. There is no tenderness. There is no rebound and no guarding.  Musculoskeletal: Normal range of motion.  Neurological: She is alert and oriented to person, place, and time. No cranial nerve deficit. Coordination normal.  Cranial nerves 3-12 intact. 5/5 motor strength. Sensation to light touch intact. Finger to nose  normal. Pt ambulates with steady gait.  Skin: Skin is warm and dry.  Psychiatric: She has a normal mood and affect. Her behavior is normal.  Nursing note and vitals reviewed.   ED Course  Procedures (including critical care time) Labs Review Labs Reviewed - No data to display  Imaging Review No results found. I have personally reviewed and evaluated these images and lab results as part of my medical decision-making.   EKG Interpretation None      MDM   Final diagnoses:  Headache, unspecified headache type   Pt presenting with headache x 4 days. Pt reports history of headaches with similar features to this. Also complaining of right sided facial "swelling" and congestion. VSS. Non-focal neuro exam. No facial swelling noted. Right sided sinus tenderness with congestion present. Headache cocktail of reglan, benadryl and decadron given with significant relief. Pt exhibiting possible signs of URI with her typical headache features. Pt asking to go home after headache cocktail given due to resolution of symptoms. Return precautions given in paperwork and discussed with pt. Pt states understanding. Pt stable for discharge.     Danielle Heimlich, PA-C 06/29/15 1910  Loren Racer, MD 06/29/15 [phone]

## 2015-06-29 NOTE — ED Notes (Signed)
Pt states she has had a headache x 4 days  Pt states the right side of her face feels swollen and she has nausea and dizziness  PT states she has been taking tylenol for the headache without relief

## 2015-06-29 NOTE — ED Notes (Signed)
Patient c/o headache x4 days. Patient states she has a hx of headaches and typically takes acetaminophen for them. Patient states it is not working this time, last dose was last night. Patient states she is also dizzy and nauseated. Patient states she has had 3 episodes of emesis in past 24 hours. Patient states her face is swelling for the past few days as well. No swelling visualized.

## 2015-06-29 NOTE — ED Notes (Signed)
Patient ambulatory to restroom  ?

## 2015-08-30 ENCOUNTER — Encounter (HOSPITAL_COMMUNITY): Payer: Self-pay | Admitting: Emergency Medicine

## 2015-08-30 ENCOUNTER — Emergency Department (HOSPITAL_COMMUNITY): Payer: Medicaid Other

## 2015-08-30 ENCOUNTER — Emergency Department (HOSPITAL_COMMUNITY)
Admission: EM | Admit: 2015-08-30 | Discharge: 2015-08-30 | Disposition: A | Discharge status: Home / Self Care | Payer: Medicaid Other | Attending: Emergency Medicine | Admitting: Emergency Medicine

## 2015-08-30 DIAGNOSIS — F419 Anxiety disorder, unspecified: Secondary | ICD-10-CM | POA: Insufficient documentation

## 2015-08-30 DIAGNOSIS — R202 Paresthesia of skin: Secondary | ICD-10-CM | POA: Insufficient documentation

## 2015-08-30 DIAGNOSIS — R0789 Other chest pain: Secondary | ICD-10-CM

## 2015-08-30 DIAGNOSIS — J45901 Unspecified asthma with (acute) exacerbation: Secondary | ICD-10-CM | POA: Insufficient documentation

## 2015-08-30 DIAGNOSIS — Z8719 Personal history of other diseases of the digestive system: Secondary | ICD-10-CM | POA: Insufficient documentation

## 2015-08-30 DIAGNOSIS — Z79899 Other long term (current) drug therapy: Secondary | ICD-10-CM | POA: Insufficient documentation

## 2015-08-30 DIAGNOSIS — M25512 Pain in left shoulder: Secondary | ICD-10-CM | POA: Insufficient documentation

## 2015-08-30 DIAGNOSIS — M25511 Pain in right shoulder: Secondary | ICD-10-CM | POA: Insufficient documentation

## 2015-08-30 DIAGNOSIS — E669 Obesity, unspecified: Secondary | ICD-10-CM | POA: Insufficient documentation

## 2015-08-30 LAB — COMPREHENSIVE METABOLIC PANEL
ALBUMIN: 3.5 g/dL (ref 3.5–5.0)
ALK PHOS: 70 U/L (ref 38–126)
ALT: 13 U/L — ABNORMAL LOW (ref 14–54)
ANION GAP: 6 (ref 5–15)
AST: 14 U/L — ABNORMAL LOW (ref 15–41)
BUN: 7 mg/dL (ref 6–20)
CO2: 28 mmol/L (ref 22–32)
Calcium: 8.7 mg/dL — ABNORMAL LOW (ref 8.9–10.3)
Chloride: 105 mmol/L (ref 101–111)
Creatinine, Ser: 0.99 mg/dL (ref 0.44–1.00)
GFR calc Af Amer: >60 mL/min (ref >60)
GFR calc non Af Amer: >60 mL/min (ref >60)
GLUCOSE: 155 mg/dL — AB (ref 65–99)
POTASSIUM: 3.7 mmol/L (ref 3.5–5.1)
SODIUM: 139 mmol/L (ref 135–145)
Total Bilirubin: 0.6 mg/dL (ref 0.3–1.2)
Total Protein: 7 g/dL (ref 6.5–8.1)

## 2015-08-30 LAB — CBC
HCT: 42.2 % (ref 36.0–46.0)
Hemoglobin: 13.2 g/dL (ref 12.0–15.0)
MCH: 26.9 pg (ref 26.0–34.0)
MCHC: 31.3 g/dL (ref 30.0–36.0)
MCV: 85.9 fL (ref 78.0–100.0)
PLATELETS: 306 10*3/uL (ref 150–400)
RBC: 4.91 MIL/uL (ref 3.87–5.11)
RDW: 13.5 % (ref 11.5–15.5)
WBC: 4.7 10*3/uL (ref 4.0–10.5)

## 2015-08-30 LAB — URINE MICROSCOPIC-ADD ON: Bacteria, UA: NONE SEEN

## 2015-08-30 LAB — URINALYSIS, ROUTINE W REFLEX MICROSCOPIC
Bilirubin Urine: NEGATIVE
Glucose, UA: NEGATIVE mg/dL
Ketones, ur: NEGATIVE mg/dL
LEUKOCYTES UA: NEGATIVE
Nitrite: NEGATIVE
PH: 7.5 (ref 5.0–8.0)
Protein, ur: NEGATIVE mg/dL
SPECIFIC GRAVITY, URINE: 1.02 (ref 1.005–1.030)

## 2015-08-30 LAB — I-STAT TROPONIN, ED: TROPONIN I, POC: 0 ng/mL (ref 0.00–0.08)

## 2015-08-30 LAB — LIPASE, BLOOD: Lipase: 22 U/L (ref 11–51)

## 2015-08-30 MED ORDER — HYDROCODONE-ACETAMINOPHEN 5-325 MG PO TABS: ORAL_TABLET | ORAL | Status: DC

## 2015-08-30 MED ORDER — KETOROLAC TROMETHAMINE 30 MG/ML IJ SOLN
30.0000 mg | Freq: Once | INTRAMUSCULAR | Status: AC
  Administered 2015-08-30: 30 mg via INTRAVENOUS
  Filled 2015-08-30: qty 1

## 2015-08-30 MED ORDER — SODIUM CHLORIDE 0.9 % IV SOLN: 10.0000 mL/h | Freq: Once | INTRAVENOUS | Status: DC

## 2015-08-30 MED ORDER — DIAZEPAM 2 MG PO TABS
2.0000 mg | ORAL_TABLET | Freq: Once | ORAL | Status: AC
  Administered 2015-08-30: 2 mg via ORAL
  Filled 2015-08-30: qty 1

## 2015-08-30 MED ORDER — HYDROXYZINE HCL 25 MG PO TABS: 25.0000 mg | ORAL_TABLET | Freq: Four times a day (QID) | ORAL | Status: DC | PRN

## 2015-08-30 NOTE — ED Provider Notes (Signed)
CSN: [id number]     Arrival date & time 08/30/15  0945 History   First MD Initiated Contact with Patient 08/30/15 1009     Chief Complaint  Patient presents with  . Chest Pain     (Consider location/radiation/quality/duration/timing/severity/associated sxs/prior Treatment) HPI  Pulse 77, temperature 98.5 F (36.9 C), temperature source Oral, resp. rate 18, height  (1.727 m), weight 279 lb (126.554 kg), last menstrual period 11/01/2011, SpO2 98 %.  Danielle Pitts is a 39 y.o. female complaining of Substernal chest pain intermittently over the course of last week significantly worsening over the last 48 hours. Patient took Tylenol PM last night but it didn't help her sleep through the night. Pain has been constant over 48 hours, it states it's worse when she lies supine. It associated with bilateral shoulder pain, bilateral hand paresthesias, dry cough and intermittent shortness of breath. Has multiple prior episodes of similar chest pain which she attributed to anxiety his only happens when she is stressed. 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 Patient denies suicidal ideation, homicidal ideation, alcohol or drug abuse, patient feels safe at home and does not have access to firearms. An eyes history of DVT or PE, exogenous estrogen, cancer, chemotherapy, recent trips or surgeries. Patient states that she has bilateral shin pain intermittently which she's had for years and is not new. Family history of ACS and paternal grandfather. Denies diabetes, hypertension, hyperlipidemia, tobacco use.    Past Medical History  Diagnosis Date  . Asthma   . Anxiety   . Vertigo   . GERD (gastroesophageal reflux disease)    Past Surgical History  Procedure Laterality Date  . Abdominal hysterectomy    . Laproscopy     Family History  Problem Relation Age of  Onset  . Hypertension Other   . Diabetes Other   . Cancer Other    Social History  Substance Use Topics  . Smoking status: Never Smoker   . Smokeless tobacco: None  . Alcohol Use: Yes   OB History    No data available     Review of Systems  10 systems reviewed and found to be negative, except as noted in the HPI.   Allergies  Aspirin  Home Medications   Prior to Admission medications   Medication Sig Start Date End Date Taking? Authorizing Provider  diphenhydramine-acetaminophen (TYLENOL PM) 25-500 MG TABS tablet Take 2 tablets by mouth at bedtime as needed. For sleep   Yes Historical Provider, MD  acetaminophen (TYLENOL) 500 MG tablet Take 1,000 mg by mouth every 6 (six) hours as needed for headache.    Historical Provider, MD  HYDROcodone-acetaminophen (NORCO/VICODIN) 5-325 MG tablet Take 1-2 tablets by mouth every 6 hours as needed for pain and/or cough. 08/30/15   Armentha Branagan, PA-C  hydrOXYzine (ATARAX/VISTARIL) 25 MG tablet Take 1 tablet (25 mg total) by mouth every 6 (six) hours as needed for anxiety. 08/30/15   Rylyn Zawistowski, PA-C   BP 130/66 mmHg  Pulse 70  Temp(Src) 98.5 F (36.9 C) (Oral)  Resp 17  Ht  (1.727 m)  Wt 279 lb (126.554 kg)  BMI 42.43 kg/m2  SpO2 100%  LMP 11/01/2011 Physical Exam  Constitutional: She is oriented to person, place, and time. She appears well-developed and well-nourished. No distress.  obese  HENT:  Head: Normocephalic.  Mouth/Throat:  Oropharynx is clear and moist.  Eyes: Conjunctivae are normal.  Neck: Normal range of motion. No JVD present. No tracheal deviation present.  Cardiovascular: Normal rate, regular rhythm and intact distal pulses.   Radial pulse equal bilaterally  Pulmonary/Chest: Effort normal and breath sounds normal. No stridor. No respiratory distress. She has no wheezes. She has no rales. She exhibits no tenderness.  Abdominal: Soft. She exhibits no distension and no mass. There is no tenderness.  There is no rebound and no guarding.  Musculoskeletal: Normal range of motion. She exhibits no edema or tenderness.  No calf asymmetry, superficial collaterals, palpable cords, edema, Homans sign negative bilaterally.    Neurological: She is alert and oriented to person, place, and time.  Skin: Skin is warm. She is not diaphoretic.  Psychiatric: She has a normal mood and affect.  Nursing note and vitals reviewed.   ED Course  Procedures (including critical care time) Labs Review Labs Reviewed  COMPREHENSIVE METABOLIC PANEL - Abnormal; Notable for the following:    Glucose, Bld 155 (*)    Calcium 8.7 (*)    AST 14 (*)    ALT 13 (*)    All other components within normal limits  URINALYSIS, ROUTINE W REFLEX MICROSCOPIC (NOT AT [facility]) - Abnormal; Notable for the following:    Hgb urine dipstick TRACE (*)    All other components within normal limits  URINE MICROSCOPIC-ADD ON - Abnormal; Notable for the following:    Squamous Epithelial / LPF 0-5 (*)    All other components within normal limits  CBC  LIPASE, BLOOD  I-STAT TROPOININ, ED    Imaging Review Dg Chest 2 View  08/30/2015  CLINICAL DATA:  Onset of chest pain Monday at work, centrally located radiating to arms and back, associated shortness of breath and nausea, history asthma, GERD EXAM: CHEST  2 VIEW COMPARISON:  09/26/2014 FINDINGS: Normal heart size, mediastinal contours, and pulmonary vascularity. Lungs clear. No pneumothorax. Bones unremarkable. IMPRESSION: Normal exam. Electronically Signed   By: Ulyses Southward M.D.   On: 08/30/2015 10:47   I have personally reviewed and evaluated these images and lab results as part of my medical decision-making.   EKG Interpretation   Date/Time:  Wednesday August 30 2015 09:54:44 EST Ventricular Rate:  80 PR Interval:  146 QRS Duration: 88 QT Interval:  378 QTC Calculation: 435 R Axis:   72 Text Interpretation:  Normal sinus rhythm Normal ECG no significant change  since 2015  Confirmed by GOLDSTON  MD, SCOTT ([id number]) on 08/30/2015 10:16:10  AM      MDM   Final diagnoses:  Atypical chest pain    Filed Vitals:   08/30/15 0950 08/30/15 1119  BP:  130/66  Pulse: 77 70  Temp: 98.5 F (36.9 C)   TempSrc: Oral   Resp: 18 17  Height:  (1.727 m)   Weight: 279 lb (126.554 kg)   SpO2: 98% 100%    Medications  ketorolac (TORADOL) 30 MG/ML injection 30 mg (30 mg Intravenous Given 08/30/15 1118)  diazepam (VALIUM) tablet 2 mg (2 mg Oral Given 08/30/15 1118)    Danielle Pitts is 39 y.o. female presenting with Chest pain onset 2 days ago associated with anxiety and life stressors. Patient does not have any acute psychological issues that would require emergent intervention. Her cardiac workup is reassuring with no abnormality on EKG, chest x-ray, blood work. Troponin is negative, she's had this pain for the last 48 hours without relief. Patient is  low risk by heart score. Pt is low risk by Wells Criteria and PERC negative. I don't think it's necessary to obtain a second troponin. I have discussed the case with case manager who has connected her and with primary care. With respect to the lack of electricity and heat in her house I was advised that she can call 211 for assistance. Afternoon. This to the patient. We've had an extensive discussion of return precautions and patient verbalizes her understanding.  Discussed case with attending physician who agrees with care plan and disposition.   Evaluation does not show pathology that would require ongoing emergent intervention or inpatient treatment. Pt is hemodynamically stable and mentating appropriately. Discussed findings and plan with patient/guardian, who agrees with care plan. All questions answered. Return precautions discussed and outpatient follow up given.   New Prescriptions   HYDROCODONE-ACETAMINOPHEN (NORCO/VICODIN) 5-325 MG TABLET    Take 1-2 tablets by mouth every 6 hours as needed for pain and/or  cough.   HYDROXYZINE (ATARAX/VISTARIL) 25 MG TABLET    Take 1 tablet (25 mg total) by mouth every 6 (six) hours as needed for anxiety.         Danielle Emeryicole Shaughnessy Gethers, PA-C 08/30/15 1129  Pricilla LovelessScott Goldston, MD 08/30/15 [phone]

## 2015-08-30 NOTE — ED Notes (Signed)
Patient transported to X-ray 

## 2015-08-30 NOTE — Discharge Instructions (Signed)
Del 211 from your phone, they can help you with the electricity and heating issues are having.  You have a primary care appointment set up at the sickle cell clinic (this is notan air. ACT primary care patients at the sickle cell clinic).  Do not hesitate to return to the emergency room for any new, worsening or concerning symptoms.  Starting tomorrow you can take ibuprofen (also known as Motrin or Advil) 800mg  (this is normally 4 over the counter pills) 3 times a day  for 5 days. Take with food to minimize stomach irritation.   Nonspecific Chest Pain It is often hard to find the cause of chest pain. There is always a chance that your pain could be related to something serious, such as a heart attack or a blood clot in your lungs. Chest pain can also be caused by conditions that are not life-threatening. If you have chest pain, it is very important to follow up with your doctor.  HOME CARE  If you were prescribed an antibiotic medicine, finish it all even if you start to feel better.  Avoid any activities that cause chest pain.  Do not use any tobacco products, including cigarettes, chewing tobacco, or electronic cigarettes. If you need help quitting, ask your doctor.  Do not drink alcohol.  Take medicines only as told by your doctor.  Keep all follow-up visits as told by your doctor. This is important. This includes any further testing if your chest pain does not go away.  Your doctor may tell you to keep your head raised (elevated) while you sleep.  Make lifestyle changes as told by your doctor. These may include:  Getting regular exercise. Ask your doctor to suggest some activities that are safe for you.  Eating a heart-healthy diet. Your doctor or a diet specialist (dietitian) can help you to learn healthy eating options.  Maintaining a healthy weight.  Managing diabetes, if necessary.  Reducing stress. GET HELP IF:  Your chest pain does not go away, even after  treatment.  You have a rash with blisters on your chest.  You have a fever. GET HELP RIGHT AWAY IF:  Your chest pain is worse.  You have an increasing cough, or you cough up blood.  You have severe belly (abdominal) pain.  You feel extremely weak.  You pass out (faint).  You have chills.  You have sudden, unexplained chest discomfort.  You have sudden, unexplained discomfort in your arms, back, neck, or jaw.  You have shortness of breath at any time.  You suddenly start to sweat, or your skin gets clammy.  You feel nauseous.  You vomit.  You suddenly feel light-headed or dizzy.  Your heart begins to beat quickly, or it feels like it is skipping beats. These symptoms may be an emergency. Do not wait to see if the symptoms will go away. Get medical help right away. Call your local emergency services (911 in the U.S[place] Do not drive yourself to the hospital.   This information is not intended to replace advice given to you by your health care provider. Make sure you discuss any questions you have with your health care provider.   Document Released: 03/18/2008 Document Revised: 10/21/2014 Document Reviewed: 05/06/2014 Elsevier Interactive Patient Education 2016 [employer].   Emergency Department Resource Guide 1) Find a Doctor and Pay Out of Pocket Although you won't have to find out who is covered by your insurance plan, it is a good idea to ask around  and get recommendations. You will then need to call the office and see if the doctor you have chosen will accept you as a new patient and what types of options they offer for patients who are self-pay. Some doctors offer discounts or will set up payment plans for their patients who do not have insurance, but you will need to ask so you aren't surprised when you get to your appointment.  2) Contact Your Local Health Department Not all health departments have doctors that can see patients for sick visits, but many do, so it  is worth a call to see if yours does. If you don't know where your local health department is, you can check in your phone book. The CDC also has a tool to help you locate your state's health department, and many state websites also have listings of all of their local health departments.  3) Find a Walk-in Clinic If your illness is not likely to be very severe or complicated, you may want to try a walk in clinic. These are popping up all over the country in pharmacies, drugstores, and shopping centers. They're usually staffed by nurse practitioners or physician assistants that have been trained to treat common illnesses and complaints. They're usually fairly quick and inexpensive. However, if you have serious medical issues or chronic medical problems, these are probably not your best option.  No Primary Care Doctor: - Call Health Connect at  [phone] - they can help you locate a primary care doctor that  accepts your insurance, provides certain services, etc. - Physician Referral Service- [phone]  Chronic Pain Problems: Organization         Address  Phone   Notes  [facility]  [phone] Patients need to be referred by their primary care doctor.   Medication Assistance: Organization         Address  Phone   Notes  [facility] Medication [facility] [address] [place] [place], [place] [address] [phone] --Must be a resident of [facility] -- Must have NO insurance coverage whatsoever (no Medicaid/ Medicare, etc.) -- The pt. MUST have a primary care doctor that directs their care regularly and follows them in the community   [facility]  [phone]   [employer]  [phone]    Agencies that provide inexpensive medical care: Organization         Address  Phone   Notes  [facility]  [phone]   [facility]    [phone]   [facility] [address] [place], [place] [address] [phone]   [facility] [address] [place], [place] [phone]   Planned Parenthood    [phone]   [facility]    [phone]   [facility] and [facility]  [address], [place] Phone:  [phone], Fax:  [phone] Hours of Operation:  9 am - 6 pm, M-F.  Also accepts Medicaid/Medicare and self-pay.  [facility]  [address], [place] Phone: [phone], Fax: [phone]. Hours of Operation:  8:30 am - 5:30 pm, M-F.  Also accepts Medicaid and self-pay.  [facility] [place] [address] [place] Phone: [phone]   [facility] [address] Natasha Bence [place], [place] [phone], Ext. [phone] Mondays & Thursdays: 7-9 AM.  First 15 patients are seen on  a first come, first serve basis.    Medicaid-accepting [facility] Providers:  Organization         Address  Phone   Notes  [facility] [address], [place] [phone] Also accepts self-pay patients.  [facility] [address] Laurell Josephs [place], [place]  [phone]   [facility] [address], [place] [phone]   [facility] [address], [place] [phone]   Renaye Rakers [address], [place]   [phone] Only accepts [place] patients after they have their name applied to their card.   Self-Pay (no insurance) in [facility]:  Organization         Address  Phone   Notes  Sickle Cell Patients, [facility] [address] [place], [place] [phone]   [facility] [address] [place], [place] [phone]   [facility]  [address], [place] [phone]   [facility]/Dr. Osei-Bonsu  [address] [place] or  [address], [place] [phone] Phone number for both [place] and [place] locations is the same.  [facility] and [facility] [address], [place] [phone]   [facility] [address], [place] or [address] [phone]   [facility] [address] [place] [phone], phone; [phone], fax Sees patients 1st and 3rd Saturday of every month.  Must not qualify for public or private insurance (i.e. Medicaid, Medicare, [place] Health Choice, Veterans' Benefits)  Household income should be no more than 200% of the poverty level The clinic cannot treat you if you are pregnant or think you are pregnant  Sexually transmitted diseases are not treated at the clinic.    Dental Care: Organization         Address  Phone  Notes  [facility] [address] [place], [place] [phone] Accepts children up to age 44 who are enrolled in [place] or [place] Health Choice; pregnant women with a Medicaid card; and children who have applied for Medicaid or [place] Health Choice, but were declined, whose parents can pay a reduced fee at time of service.  [facility]  [address], [place] [phone] Accepts children up to age 57 who are enrolled in [place] or [place] Health Choice; pregnant women with a Medicaid card; and children who have applied for Medicaid or [place] Health Choice, but were declined, whose parents can pay a reduced fee at time of service.  [facility]  [address] [place], [place] [phone] Patients are seen by appointment only. Walk-ins are not accepted. [facility] will see patients 46 years of age and older. Monday - Tuesday (8am-5pm) Most Wednesdays (8:30-5pm) $30 per visit, cash only  [facility] Adult Dental Access [facility]  [address], [facility] [phone] Patients are seen by appointment only. Walk-ins are not accepted. [facility] will see patients 83 years of age and older. One Wednesday Evening (Monthly: Volunteer Based).  $30 per visit, cash only  [employer]  [phone] for adults; Children under age 20, call Graduate Pediatric Dentistry at [phone]. Children  aged 25-14, please call [phone] to request a pediatric application.  Dental services are provided in all areas of dental care including fillings, crowns and bridges, complete and partial dentures, implants, gum treatment, root canals, and extractions. Preventive care is also provided. Treatment is provided to both adults and children. Patients are selected via a lottery and there is often a waiting list.   [facility] [address] [place]  [phone] [email or web address]   [facility] [address] [place], [place] [phone], Ext. [phone] Second and Fourth Thursday of each month, opens at 6:30 AM; Clinic ends at 9 AM.  Patients are seen on a first-come first-served basis, and a limited number are seen during each clinic.   [facility]  [address] Ether Griffins [place], [place] [phone]   Eligibility Requirements You must have lived in [place], [place], or [place] counties for at least the last three months.   You cannot be eligible for state or federal sponsored [place], including [employer], [place], or [employer].   You generally cannot be eligible for healthcare insurance through your employer.    How to apply: Eligibility screenings are held every Tuesday and Wednesday afternoon from 1:00 pm until 4:00 pm. You do not need an appointment for the interview!  [facility] [address], [place], [place] [phone]   [facility] Health Department  [phone]   [facility] Health Department  [phone]   [facility]  Health Department  [phone]    Behavioral Health Resources in the Community: Intensive Outpatient Programs Organization         Address  Phone  Notes  [facility] [address], [place], [place] [phone]   [facility] Outpatient [address], [place], [place] [phone]   [facility] [address], [place], [place]  [phone]   [facility] Mental Health [address]  [place], [place] [phone] or [phone]   Substance Abuse Resources Organization         Address  Phone  Notes  Al[facility] Drug Services  [phone]   [facility]  [phone]   [facility]  [phone]   Floydene Flock  [phone]   Residential & Outpatient Substance Abuse Program  [phone]   Psychological Services Organization         Address  Phone  Notes  [facility]  [phone]   [facility]  [phone]   [facility] Mental Health [address], [place] [phone] or [phone]    Mobile Crisis Teams Organization         Address  Phone  Notes  [facility], Mobile Crisis Care Unit  [phone]   [facility]  [address] [place], [place] [phone]   Doristine Locks [address] [place] [phone]    Self-Help/Support Groups Organization         Address  Phone             Notes  [facility] [place] - variety of support groups  336- I7437963 Call for more information  Narcotics Anonymous (NA), [facility] [address], [employer] [place]  2 meetings at this location   Statistician         Address  Phone  Notes  [facility] [address],    [place]  [phone]  [facility]  [address], [place] [phone], [place], [place] [phone]   [facility] [address], [place] [phone]  Admissions: 8am-3pm M-F  [facility] [address]    [place], [place] [phone]   [facility] [address], [place], [place] [phone]   [facility] [address]  [place], [place] [phone]   [facility] - Intensive Outpatient [address], [place], [place] [phone]   [facility] ([facility] [address]  [place], [place] [phone] or [phone]   [facility] ([facility]) [address] [place], [place] [phone] Accepts Medicaid  [facility] [address]  [place] [phone] Substance Abuse/Addiction Treatment

## 2015-08-30 NOTE — Care Management Note (Signed)
Case Management Note  Patient Details  Name: Danielle Pitts MRN: [id number] Date of Birth: [birth year]  Subjective/Objective:                  39 y.o. female complaining of Substernal chest pain intermittently over the course of last week significantly worsening over the last 48 hours.//Home alone  Action/Plan: Follow for disposition needs.   Expected Discharge Date:       08/30/15           Expected Discharge Plan:  Home/Self Care  In-House Referral:  PCP / [facility]  Discharge planning Services  CM Consult, Follow-up appt scheduled, [facility] Acute Care Choice:    Choice offered to:  Patient  DME Arranged:  N/A DME Agency:  NA  HH Arranged:  NA HH Agency:  NA  Status of Service:  Completed, signed off  Medicare Important Message Given:    Date Medicare IM Given:    Medicare IM give by:    Date Additional Medicare IM Given:    Additional Medicare Important Message give by:     If discussed at [employer] of Stay Meetings, dates discussed:    Additional Comments: Danielle Marban J. Clydene Laming, RN, BSN, [place] [phone] ED CM consulted regarding PCP establishment and insurance enrollment. Pt presented to [facility] ED today with chest pain. NCM met with pt at bedside; pt confirms not having access to f/u care with PCP or insurance coverage. Discussed with patient importance and benefits of establishing PCP, and not utilizing the ED for primary care needs. Pt verbalized understanding and is in agreement. Discussed other options, provided list of local  affordable PCPs.  Pt voiced interest in the [facility] and [facility].  NCM advised that [facility]  Internal Medicine providers are seeing pts at [facility]. Pt verbalized understanding. NCM set up appointment with Sharon Seller, NP 09/21/15 at [place].  Danielle Mandril, RN 08/30/2015, 11:06 AM

## 2015-08-30 NOTE — ED Notes (Signed)
[facility] at [facility]8. Pt was pulled OTC in epic and ordered were ordered under this pts chart in error. Dr.Goldston made aware. And orders were d/c'ed

## 2015-08-30 NOTE — ED Notes (Signed)
C/p onset Monday at work, central with radiation to arms and back with SOB, nausea, alert, ambulatory and in NAD

## 2015-09-21 ENCOUNTER — Encounter: Payer: Self-pay | Admitting: Family Medicine

## 2015-09-21 ENCOUNTER — Ambulatory Visit (INDEPENDENT_AMBULATORY_CARE_PROVIDER_SITE_OTHER): Payer: Medicaid Other | Admitting: Family Medicine

## 2015-09-21 VITALS — BP 134/65 | HR 75 | Temp 98.1°F | Ht 68.0 in | Wt 278.0 lb

## 2015-09-21 DIAGNOSIS — K219 Gastro-esophageal reflux disease without esophagitis: Secondary | ICD-10-CM | POA: Diagnosis not present

## 2015-09-21 DIAGNOSIS — J4520 Mild intermittent asthma, uncomplicated: Secondary | ICD-10-CM

## 2015-09-21 DIAGNOSIS — Z7189 Other specified counseling: Secondary | ICD-10-CM | POA: Diagnosis not present

## 2015-09-21 DIAGNOSIS — R739 Hyperglycemia, unspecified: Secondary | ICD-10-CM | POA: Diagnosis not present

## 2015-09-21 DIAGNOSIS — Z7689 Persons encountering health services in other specified circumstances: Secondary | ICD-10-CM

## 2015-09-21 DIAGNOSIS — F418 Other specified anxiety disorders: Secondary | ICD-10-CM

## 2015-09-21 LAB — HEMOGLOBIN A1C
HEMOGLOBIN A1C: 6.1 % — AB (ref <5.7)
Mean Plasma Glucose: 128 mg/dL — ABNORMAL HIGH (ref <117)

## 2015-09-21 LAB — LIPID PANEL
Cholesterol: 185 mg/dL (ref 125–200)
HDL: 48 mg/dL (ref >=46)
LDL CALC: 116 mg/dL (ref <130)
TRIGLYCERIDES: 104 mg/dL (ref <150)
Total CHOL/HDL Ratio: 3.9 Ratio (ref <=5.0)
VLDL: 21 mg/dL (ref <30)

## 2015-09-21 MED ORDER — PRAVASTATIN SODIUM 40 MG PO TABS: 40.0000 mg | ORAL_TABLET | Freq: Every day | ORAL | Status: DC

## 2015-09-21 MED ORDER — ALBUTEROL SULFATE HFA 108 (90 BASE) MCG/ACT IN AERS: 2.0000 | INHALATION_SPRAY | Freq: Four times a day (QID) | RESPIRATORY_TRACT | Status: DC | PRN

## 2015-09-21 MED ORDER — SERTRALINE HCL 50 MG PO TABS: 50.0000 mg | ORAL_TABLET | Freq: Every day | ORAL | Status: DC

## 2015-09-21 NOTE — Patient Instructions (Addendum)
You can pick up your medications at [facility] at [address].They may be able to help you get your medications free or at a reduced price. Come back in one month to see how you are doing on medication for GERD and for depression/anxiety. When you get a bill, take it to [facility] and apply for [facility]. Also, apply for orange card. Ask about the pharmacy I am sending your prescription to.

## 2015-09-21 NOTE — Progress Notes (Addendum)
Patient ID: Danielle Pitts, female   DOB: September 15, 1976, 39 y.o.   MRN: [id number]   Danielle Pitts, is a 39 y.o. female  [id number]  [id number]  DOB - 1976/06/24  CC:  Chief Complaint  Patient presents with  . Hospitalization Follow-up    chest pain       HPI: Danielle Pitts is a 39 y.o. female here to establish care. She has not had a primary care doctor in several years. She was seen recently in the ED with mid-sternal chest, which was determined not to be of cardiac origin. The diagnosis was presumptively made as anxiety, of which she has a history. She has been on Zoloft in the past but not currently. She was also diagnosed with GERD. She reports have upper abdominal and lower chest pain frequently which is different from the pain she went to hospital for, which is a pressure sensation. She also has a history of Asthma and occassional vertigo. She is not currently on chronic medications.  She reports not using tobacco or drugs and only occassional alcohol. She has a history of elevated BS in the ED (155) Allergies  Allergen Reactions  . Aspirin Hives    Hallucinations   Past Medical History  Diagnosis Date  . Asthma   . Anxiety   . Vertigo   . GERD (gastroesophageal reflux disease)    Current Outpatient Prescriptions on File Prior to Visit  Medication Sig Dispense Refill  . acetaminophen (TYLENOL) 500 MG tablet Take 1,000 mg by mouth every 6 (six) hours as needed for headache.    . diphenhydramine-acetaminophen (TYLENOL PM) 25-500 MG TABS tablet Take 2 tablets by mouth at bedtime as needed. For sleep    . hydrOXYzine (ATARAX/VISTARIL) 25 MG tablet Take 1 tablet (25 mg total) by mouth every 6 (six) hours as needed for anxiety. (Patient not taking: Reported on 09/21/2015) 12 tablet 0   No current facility-administered medications on file prior to visit.   Family History  Problem Relation Age of Onset  . Hypertension Other   . Diabetes Other   . Cancer Other     Social History   Social History  . Marital Status: Married    Spouse Name: N/A  . Number of Children: N/A  . Years of Education: N/A   Occupational History  . Not on file.   Social History Main Topics  . Smoking status: Never Smoker   . Smokeless tobacco: Not on file  . Alcohol Use: Yes  . Drug Use: No  . Sexual Activity: Not Currently   Other Topics Concern  . Not on file   Social History Narrative    Review of Systems: Constitutional: Negative for fever, chills, appetite change, weight loss. Positive for  Fatigue. Skin: Negative for rashes or lesions of concern. HENT: Negative for ear pain, ear discharge.nose bleeds Eyes: Negative for pain, discharge, redness, itching and visual disturbance. Neck: Negative for pain, stiffness Respiratory: Negative for cough, shortness of breath,   Cardiovascular: Negative for cardiac chest pain, palpitations and leg swelling.Positive for chest pain mostly related to anxiety Gastrointestinal: Positive  for abdominal pain, nausea, vomiting, diarrhea heartburn.   Genitourinary: Negative for dysuria, urgency, frequency, hematuria,  Musculoskeletal: Positive  for back pain, knee pain. joint  swelling, and gait problem.Negative for weakness.Positive for multiple joint pain Neurological: Negative for tremors, seizures, syncope,   light-headedness, numbness.  Positive for occassional vertigo and headaches.  Hematological: Negative for easy bruising or bleeding Psychiatric/Behavioral: Positive  for depression, anxiety,  Objective:   Filed Vitals:   09/21/15 0906  BP: 134/65  Pulse: 75  Temp: 98.1 F (36.7 C)    Physical Exam: Constitutional: Patient appears well-developed and well-nourished. No distress. HENT: Normocephalic, atraumatic, External right and left ear normal. Oropharynx is clear and moist.  Eyes: Conjunctivae and EOM are normal. PERRLA, no scleral icterus. Neck: Normal ROM. Neck supple. No lymphadenopathy, No  thyromegaly. CVS: RRR, S1/S2 +, no murmurs, no gallops, no rubs Pulmonary: Effort and breath sounds normal, no stridor, rhonchi, wheezes, rales.  Abdominal: Soft. Normoactive BS,, no distension, tenderness, rebound or guarding.  Musculoskeletal: Normal range of motion. No edema and no tenderness.  Neuro: Alert.Normal muscle tone coordination. Non-focal Skin: Skin is warm and dry. No rash noted. Not diaphoretic. No erythema. No pallor. Psychiatric: Normal mood and affect. Behavior, judgment, thought content normal.  Lab Results  Component Value Date   WBC 4.7 08/30/2015   HGB 13.2 08/30/2015   HCT 42.2 08/30/2015   MCV 85.9 08/30/2015   PLT 306 08/30/2015   Lab Results  Component Value Date   CREATININE 0.99 08/30/2015   BUN 7 08/30/2015   NA 139 08/30/2015   K 3.7 08/30/2015   CL 105 08/30/2015   CO2 28 08/30/2015    No results found for: HGBA1C Lipid Panel  No results found for: CHOL, TRIG, HDL, CHOLHDL, VLDL, LDLCALC     Assessment and plan:   1. Hyperglycemia - Hemoglobin A1c - Lipid panel  2. Enounter to establish care -I have review information presented by the patient and [facility].  3. Asthma, mild intermittent, uncomplicated - albuterol (PROVENTIL HFA;VENTOLIN HFA) 108 (90 BASE) MCG/ACT inhaler; Inhale 2 puffs into the lungs every 6 (six) hours as needed for wheezing or shortness of breath.  Dispense: 1 Inhaler; Refill: 2  4. Gastroesophageal reflux disease, esophagitis presence not specified - pravastatin (PRAVACHOL) 40 MG tablet; Take 1 tablet (40 mg total) by mouth daily.  Dispense: 90 tablet; Refill: 3  5. Depression with anxiety  - sertraline (ZOLOFT) 50 MG tablet; Take 1 tablet (50 mg total) by mouth daily.  Dispense: 30 tablet; Refill: 1   Return in about 1 month (around 10/22/2015).  The patient was given clear instructions to go to ER or return to medical center if symptoms don't improve, worsen or new problems  develop. The patient verbalized understanding.    Henrietta HooverLinda C Jamaree Hosier FNP  09/21/2015, 11:51 AM

## 2015-10-23 ENCOUNTER — Ambulatory Visit: Payer: Medicaid Other | Admitting: Family Medicine

## 2015-10-26 ENCOUNTER — Ambulatory Visit: Payer: Medicaid Other | Admitting: Family Medicine

## 2016-01-25 ENCOUNTER — Emergency Department (HOSPITAL_COMMUNITY): Payer: Self-pay

## 2016-01-25 ENCOUNTER — Encounter (HOSPITAL_COMMUNITY): Payer: Self-pay | Admitting: *Deleted

## 2016-01-25 ENCOUNTER — Emergency Department (HOSPITAL_COMMUNITY)
Admission: EM | Admit: 2016-01-25 | Discharge: 2016-01-26 | Disposition: A | Discharge status: Home / Self Care | Payer: Self-pay | Attending: Emergency Medicine | Admitting: Emergency Medicine

## 2016-01-25 DIAGNOSIS — Z8719 Personal history of other diseases of the digestive system: Secondary | ICD-10-CM | POA: Insufficient documentation

## 2016-01-25 DIAGNOSIS — G43409 Hemiplegic migraine, not intractable, without status migrainosus: Secondary | ICD-10-CM | POA: Insufficient documentation

## 2016-01-25 DIAGNOSIS — R079 Chest pain, unspecified: Secondary | ICD-10-CM | POA: Insufficient documentation

## 2016-01-25 DIAGNOSIS — Z8659 Personal history of other mental and behavioral disorders: Secondary | ICD-10-CM | POA: Insufficient documentation

## 2016-01-25 DIAGNOSIS — M6289 Other specified disorders of muscle: Secondary | ICD-10-CM

## 2016-01-25 DIAGNOSIS — Z79899 Other long term (current) drug therapy: Secondary | ICD-10-CM | POA: Insufficient documentation

## 2016-01-25 DIAGNOSIS — J45909 Unspecified asthma, uncomplicated: Secondary | ICD-10-CM | POA: Insufficient documentation

## 2016-01-25 LAB — CBC
HCT: 41.7 % (ref 36.0–46.0)
HEMOGLOBIN: 13.5 g/dL (ref 12.0–15.0)
MCH: 27.4 pg (ref 26.0–34.0)
MCHC: 32.4 g/dL (ref 30.0–36.0)
MCV: 84.8 fL (ref 78.0–100.0)
Platelets: 329 10*3/uL (ref 150–400)
RBC: 4.92 MIL/uL (ref 3.87–5.11)
RDW: 13.3 % (ref 11.5–15.5)
WBC: 6.3 10*3/uL (ref 4.0–10.5)

## 2016-01-25 LAB — I-STAT TROPONIN, ED: TROPONIN I, POC: 0 ng/mL (ref 0.00–0.08)

## 2016-01-25 LAB — BASIC METABOLIC PANEL
ANION GAP: 11 (ref 5–15)
BUN: 9 mg/dL (ref 6–20)
CALCIUM: 9.2 mg/dL (ref 8.9–10.3)
CHLORIDE: 101 mmol/L (ref 101–111)
CO2: 25 mmol/L (ref 22–32)
Creatinine, Ser: 0.83 mg/dL (ref 0.44–1.00)
GFR calc non Af Amer: >60 mL/min (ref >60)
Glucose, Bld: 100 mg/dL — ABNORMAL HIGH (ref 65–99)
POTASSIUM: 4 mmol/L (ref 3.5–5.1)
Sodium: 137 mmol/L (ref 135–145)

## 2016-01-25 MED ORDER — DIPHENHYDRAMINE HCL 50 MG/ML IJ SOLN
25.0000 mg | Freq: Once | INTRAMUSCULAR | Status: AC
  Administered 2016-01-25: 25 mg via INTRAVENOUS
  Filled 2016-01-25: qty 1

## 2016-01-25 MED ORDER — DIPHENHYDRAMINE HCL 25 MG PO TABS: 25.0000 mg | ORAL_TABLET | Freq: Four times a day (QID) | ORAL | Status: DC

## 2016-01-25 MED ORDER — LORAZEPAM 2 MG/ML IJ SOLN
2.0000 mg | Freq: Once | INTRAMUSCULAR | Status: AC
  Administered 2016-01-25: 2 mg via INTRAVENOUS

## 2016-01-25 MED ORDER — LORAZEPAM 2 MG/ML IJ SOLN
2.0000 mg | Freq: Once | INTRAMUSCULAR | Status: DC
  Filled 2016-01-25: qty 1

## 2016-01-25 MED ORDER — METOCLOPRAMIDE HCL 10 MG PO TABS: 10.0000 mg | ORAL_TABLET | Freq: Four times a day (QID) | ORAL | Status: DC

## 2016-01-25 MED ORDER — METOCLOPRAMIDE HCL 5 MG/ML IJ SOLN
10.0000 mg | Freq: Once | INTRAMUSCULAR | Status: AC
  Administered 2016-01-25: 10 mg via INTRAVENOUS
  Filled 2016-01-25: qty 2

## 2016-01-25 NOTE — Consult Note (Signed)
Neurology Consultation Reason for Consult: Left sided weakness Referring Physician: Pfeiffer  CC: Left sided weakness  History is obtained from:patient  HPI: Danielle Pitts is a 40 y.o. female with a history of anxiety who presents with left sided numbness and weakness that started last night. She states that she he started getting tingling in her face and then it moved down her arm. She then noticed that her left side was not working as well. She went to work and felt like her arm was moving more slowly than typical. Because of persistent symptosm she sought further care here.   She does not have a true headache, but does complain of "pressure" on the left side of her head.   LKW: 9pm 4/12 Tpa?: no out of window.    ROS: A 14 point ROS was performed and is negative except as noted in the HPI.    Past Medical History  Diagnosis Date  . Asthma   . Anxiety   . Vertigo   . GERD (gastroesophageal reflux disease)      Family History  Problem Relation Age of Onset  . Hypertension Other   . Diabetes Other   . Cancer Other      Social History:  reports that she has never smoked. She does not have any smokeless tobacco history on file. She reports that she drinks alcohol. She reports that she does not use illicit drugs.   Exam: Current vital signs: BP 133/74 mmHg  Pulse 73  Temp(Src) 98.7 F (37.1 C) (Oral)  Resp 18  Ht 5\' 7"  (1.702 m)  Wt 127.007 kg (280 lb)  BMI 43.84 kg/m2  SpO2 100%  LMP 11/01/2011 Vital signs in last 24 hours: Temp:  [98.7 F (37.1 C)] 98.7 F (37.1 C) (04/13 1121) Pulse Rate:  [73] 73 (04/13 1121) Resp:  [18] 18 (04/13 1121) BP: (133)/(74) 133/74 mmHg (04/13 1121) SpO2:  [100 %] 100 % (04/13 1121) Weight:  [127.007 kg (280 lb)] 127.007 kg (280 lb) (04/13 1121)   Physical Exam  Constitutional: Appears well-developed and well-nourished.  Psych: Affect appropriate to situation Eyes: No scleral injection HENT: No OP obstrucion Head:  Normocephalic.  Cardiovascular: Normal rate and regular rhythm.  Respiratory: Effort normal and breath sounds normal to anterior ascultation GI: Soft.  No distension. There is no tenderness.  Skin: WDI  Neuro: Mental Status: Patient is awake, alert, oriented to person, place, month, year, and situation. Patient is able to give a clear and coherent history. No signs of aphasia or neglect Cranial Nerves: II: Visual Fields are full. Pupils are equal, round, and reactive to light.   III,IV, VI: EOMI without ptosis or diploplia.  V: Facial sensation is decreased in V2 and V3 VII: Facial movement is symmetric.  VIII: hearing is intact to voice X: Uvula elevates symmetrically XI: Shoulder shrug is symmetric. XII: tongue is midline without atrophy or fasciculations.  Motor: Tone is normal. Bulk is normal. 5/5 strength was present on the right, she is limited by pain in the left arm, no drift in it however.  Sensory: Sensation is deminished throughout the left side.  Deep Tendon Reflexes: 2+ and symmetric in the biceps and patellae.  Cerebellar: FNF and HKS are intact on the right, does not perform on left(states it is hard).  I have reviewed labs in epic and the results pertinent to this consultation are: cmp - unremarkable.   I have reviewed the images obtained: CT head 09/2014- negative.   Impression: 40  yo F with left sided weakness. There are some components of her story/exam that make me wonder if this could be psychogenic in origin. Complicated migraine is also possible with her complaint of head discomfort. Finally, Ischemic stroke needs to be ruled out and an MRI brian should be done to assess for this.   Recommendations: 1) MRI brain 2) could consider treating for complicated migraine.  3) If MRI is negative, no further workup is needed.    Ritta Slot, MD [facility] [phone]  If 7pm- 7am, please page neurology on call as listed in AMION.

## 2016-01-25 NOTE — ED Notes (Signed)
Pt states was talking on phone last night and experienced L sided chest tightness and L arm pressure and numbness.  States momentarily blacked out and felt her entire L side go weak.  Symptoms continue.  No focal deficits noted in triage.  Pt states great stress d/t caring for both of there parents.

## 2016-01-25 NOTE — ED Notes (Signed)
Dr. Kirkpatrick at bedside 

## 2016-01-25 NOTE — ED Provider Notes (Signed)
CSN: [id number]     Arrival date & time 01/25/16  1104 History   First MD Initiated Contact with Patient 01/25/16 1554     Chief Complaint  Patient presents with  . Chest Pain  . Numbness     (Consider location/radiation/quality/duration/timing/severity/associated sxs/prior Treatment) HPI Patient reports that she was on the telephone last night having conversation with stressful for her. She is currently responsible for caring for her parents. She reports that she started to feel very "weird and dizzy". Her left arm started to feel numb and heavy. She reports she also felt a similar sensation of heaviness and numbness in her left leg. She reports she continues to have a feeling of spinning dizziness and a discomfort on the left side of her neck. She reports slightly after that heaviness and numbness in her extremities began she started to feel a pressure-like sensation on her chest as well. She did not seek treatment yesterday evening. She reports she's had problems with anxiety before and was hoping once she relaxed and was not so anxious that it might resolve. She reports however the symptoms persist today. She reports she is able to ambulate but the leg feels somewhat heavy and she has to sit down and rest. She also reports she continues to feel a spinning quality of dizziness. She has no prior history of stroke. She reports she's had anxiety attacks that she's never had weakness or numbness like this.  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 She states that she gets headaches when she gets to work and other people seem to have some chronic cold symptoms and never get better. She is wondering if this had to do with mold exposure. She reports that once she is out of her work environment she does not suffer from chronic headaches or ongoing problems with fevers, chills or cough. Past Medical History  Diagnosis Date  . Asthma   . Anxiety   . Vertigo   . GERD (gastroesophageal reflux  disease)    Past Surgical History  Procedure Laterality Date  . Abdominal hysterectomy    . Laproscopy     Family History  Problem Relation Age of Onset  . Hypertension Other   . Diabetes Other   . Cancer Other    Social History  Substance Use Topics  . Smoking status: Never Smoker   . Smokeless tobacco: None  . Alcohol Use: Yes     Comment: occ   OB History    No data available     Review of Systems  10 Systems reviewed and are negative for acute change except as noted in the HPI.   Allergies  Aspirin and Percocet  Home Medications   Prior to Admission medications   Medication Sig Start Date End Date Taking? Authorizing Provider  acetaminophen (TYLENOL) 500 MG tablet Take 1,000 mg by mouth every 6 (six) hours as needed for headache.   Yes Historical Provider, MD  albuterol (PROVENTIL HFA;VENTOLIN HFA) 108 (90 BASE) MCG/ACT inhaler Inhale 2 puffs into the lungs every 6 (six) hours as needed for wheezing or shortness of breath. 09/21/15  Yes Henrietta HooverLinda C Bernhardt, NP  diphenhydramine-acetaminophen (TYLENOL PM) 25-500 MG TABS tablet Take 2 tablets by mouth at bedtime as needed. For sleep   Yes Historical Provider, MD  diphenhydrAMINE (BENADRYL) 25 MG tablet Take 1 tablet (25 mg total) by mouth every 6 (six) hours. 01/25/16   Arby BarretteMarcy Daneil Beem, MD  metoCLOPramide (REGLAN) 10 MG tablet Take 1 tablet (  10 mg total) by mouth every 6 (six) hours. 01/25/16   Arby Barrette, MD   BP 128/84 mmHg  Pulse 82  Temp(Src) 98.7 F (37.1 C) (Oral)  Resp 16  Ht 5\' 7"  (1.702 m)  Wt 280 lb (127.007 kg)  BMI 43.84 kg/m2  SpO2 98%  LMP 11/01/2011 Physical Exam  Constitutional: She is oriented to person, place, and time. She appears well-developed and well-nourished.  Moderately obese. Alert and nontoxic.  HENT:  Head: Normocephalic and atraumatic.  Right Ear: External ear normal.  Left Ear: External ear normal.  Nose: Nose normal.  Mouth/Throat: Oropharynx is clear and moist.  Eyes: EOM  are normal. Pupils are equal, round, and reactive to light.  Neck: Neck supple.  Cardiovascular: Normal rate, regular rhythm, normal heart sounds and intact distal pulses.   Pulmonary/Chest: Effort normal and breath sounds normal.  Abdominal: Soft. Bowel sounds are normal. She exhibits no distension. There is no tenderness.  Musculoskeletal: Normal range of motion. She exhibits no edema.  Neurological: She is alert and oriented to person, place, and time. She has normal strength. A cranial nerve deficit is present. She exhibits abnormal muscle tone. Coordination normal. GCS eye subscore is 4. GCS verbal subscore is 5. GCS motor subscore is 6.  Grip strength left 3\5, right thigh\5. Patient easily elevates right lower extremity off of the bed, patient require significant exertion to elevate left lower extremity off of the bed and cannot hold against resistance. Dorsiflexion right 5\5, dorsiflexion left 3\5. Subjective endorsement of decreased sensation to light touch on the left upper and lower extremity. Patient is aware of touch. Patient appears to have some left mouth droop with grimace. Tongue is midline. Cognitive function is normal. No evidence of a aphasia. Speech is clear. No slurring.  Skin: Skin is warm, dry and intact.  Psychiatric: She has a normal mood and affect.    ED Course  Procedures (including critical care time) Labs Review Labs Reviewed  BASIC METABOLIC PANEL - Abnormal; Notable for the following:    Glucose, Bld 100 (*)    All other components within normal limits  CBC  I-STAT TROPOININ, ED    Imaging Review Dg Chest 2 View  01/25/2016  CLINICAL DATA:  Chest pain, left shoulder pain, left arm numbness EXAM: CHEST  2 VIEW COMPARISON:  08/30/2015 FINDINGS: Cardiomediastinal silhouette is stable. No acute infiltrate or pleural effusion. No pulmonary edema. Bony thorax is unremarkable. IMPRESSION: No active cardiopulmonary disease. Electronically Signed   By: Natasha Mead  M.D.   On: 01/25/2016 12:06   Mr Brain Wo Contrast (neuro Protocol)  01/25/2016  CLINICAL DATA:  40 year old female with left side numbness and weakness since last night. Initial encounter. EXAM: MRI HEAD WITHOUT CONTRAST MRA HEAD WITHOUT CONTRAST TECHNIQUE: Multiplanar, multiecho pulse sequences of the brain and surrounding structures were obtained without intravenous contrast. Angiographic images of the head were obtained using MRA technique without contrast. COMPARISON:  Noncontrast head CT 09/26/2014. FINDINGS: MRI HEAD FINDINGS Study is intermittently degraded by motion artifact despite repeated imaging attempts. No restricted diffusion to suggest acute infarction. No midline shift, mass effect, evidence of mass lesion, ventriculomegaly, extra-axial collection or acute intracranial hemorrhage. Cervicomedullary junction and pituitary are within normal limits. Major intracranial vascular flow voids appear preserved. Cerebral volume is normal. Wallace Cullens and white matter signal is within normal limits throughout the brain. No chronic cerebral blood products identified. Visible internal auditory structures appear normal. Normal noncontrast appearance of the cavernous sinus. No skullbase abnormality identified.  Negative visualized cervical spine. Trace paranasal sinus mucosal thickening. Mastoids are clear. Negative orbit and scalp soft tissues. Visualized bone marrow signal is within normal limits. MRA HEAD FINDINGS Antegrade flow in the posterior circulation with codominant distal vertebral arteries. Normal right PICA and bilateral AICA origins, the left appears dominant. Normal vertebrobasilar junction. No basilar artery stenosis. Normal SCA and PCA origins. Normal PCA branches. Posterior communicating arteries are diminutive or absent. Antegrade flow in both ICA siphons. No siphon stenosis. Both siphons are mildly tortuous. Both ophthalmic artery origins appear normal. Patent carotid termini. Normal left MCA and  bilateral ACA origins. Left MCA branches appear normal. ACA branch detail is degraded by motion, but these appear grossly normal. "Duplicated" right MCA M1 appearance with 2 distinct vessel origins off of the distal right ICA (normal variant). Right MCA branches appear within normal limits. IMPRESSION: 1. Intermittently motion degraded but otherwise normal appearing noncontrast brain MRI. 2. Mildly motion degraded negative appearing intracranial MRA. Right MCA normal anatomic variant incidentally noted. Electronically Signed   By: Odessa Fleming M.D.   On: 01/25/2016 19:40   Mr Shirlee Latch (cerebral Arteries)  01/25/2016  CLINICAL DATA:  40 year old female with left side numbness and weakness since last night. Initial encounter. EXAM: MRI HEAD WITHOUT CONTRAST MRA HEAD WITHOUT CONTRAST TECHNIQUE: Multiplanar, multiecho pulse sequences of the brain and surrounding structures were obtained without intravenous contrast. Angiographic images of the head were obtained using MRA technique without contrast. COMPARISON:  Noncontrast head CT 09/26/2014. FINDINGS: MRI HEAD FINDINGS Study is intermittently degraded by motion artifact despite repeated imaging attempts. No restricted diffusion to suggest acute infarction. No midline shift, mass effect, evidence of mass lesion, ventriculomegaly, extra-axial collection or acute intracranial hemorrhage. Cervicomedullary junction and pituitary are within normal limits. Major intracranial vascular flow voids appear preserved. Cerebral volume is normal. Wallace Cullens and white matter signal is within normal limits throughout the brain. No chronic cerebral blood products identified. Visible internal auditory structures appear normal. Normal noncontrast appearance of the cavernous sinus. No skullbase abnormality identified. Negative visualized cervical spine. Trace paranasal sinus mucosal thickening. Mastoids are clear. Negative orbit and scalp soft tissues. Visualized bone marrow signal is within  normal limits. MRA HEAD FINDINGS Antegrade flow in the posterior circulation with codominant distal vertebral arteries. Normal right PICA and bilateral AICA origins, the left appears dominant. Normal vertebrobasilar junction. No basilar artery stenosis. Normal SCA and PCA origins. Normal PCA branches. Posterior communicating arteries are diminutive or absent. Antegrade flow in both ICA siphons. No siphon stenosis. Both siphons are mildly tortuous. Both ophthalmic artery origins appear normal. Patent carotid termini. Normal left MCA and bilateral ACA origins. Left MCA branches appear normal. ACA branch detail is degraded by motion, but these appear grossly normal. "Duplicated" right MCA M1 appearance with 2 distinct vessel origins off of the distal right ICA (normal variant). Right MCA branches appear within normal limits. IMPRESSION: 1. Intermittently motion degraded but otherwise normal appearing noncontrast brain MRI. 2. Mildly motion degraded negative appearing intracranial MRA. Right MCA normal anatomic variant incidentally noted. Electronically Signed   By: Odessa Fleming M.D.   On: 01/25/2016 19:40   I have personally reviewed and evaluated these images and lab results as part of my medical decision-making.   EKG Interpretation   Date/Time:  Thursday January 25 2016 11:17:15 EDT Ventricular Rate:  74 PR Interval:  170 QRS Duration: 90 QT Interval:  396 QTC Calculation: 439 R Axis:   63 Text Interpretation:  Normal sinus rhythm Normal ECG agree.  no change  Confirmed by Donnald Garre, MD, Lebron Conners [phone]) on 01/25/2016 4:01:12 PM     Consult: (16:15) reviewed with Dr. Amada Jupiter who will see the patient in the emergency department. MDM   Final diagnoses:  Hemiplegic migraine without status migrainosus, not intractable   MRI and MRA do not show acute anomaly. Neurology consult has been obtained. At this time Dr. Amada Jupiter feels this is consistent with complex migraine. Patient is nontoxic. She has no  meningeal signs. Her mental status is clear. Plan per review with Dr. Amada Jupiter will be for discharge with outpatient follow-up. Patient is reassessed and she is ambulatory in the emergency department with normal cognitive function and well appearance.    Arby Barrette, MD 01/26/16 [phone]

## 2016-01-25 NOTE — Discharge Instructions (Signed)

## 2016-04-29 ENCOUNTER — Emergency Department
Admission: EM | Admit: 2016-04-29 | Discharge: 2016-04-29 | Disposition: A | Discharge status: Home / Self Care | Payer: Medicaid Other | Attending: Emergency Medicine | Admitting: Emergency Medicine

## 2016-04-29 ENCOUNTER — Encounter: Payer: Self-pay | Admitting: *Deleted

## 2016-04-29 DIAGNOSIS — R51 Headache: Secondary | ICD-10-CM | POA: Insufficient documentation

## 2016-04-29 DIAGNOSIS — J45909 Unspecified asthma, uncomplicated: Secondary | ICD-10-CM | POA: Insufficient documentation

## 2016-04-29 DIAGNOSIS — R519 Headache, unspecified: Secondary | ICD-10-CM

## 2016-04-29 DIAGNOSIS — R109 Unspecified abdominal pain: Secondary | ICD-10-CM

## 2016-04-29 LAB — CBC
HCT: 42.7 % (ref 35.0–47.0)
HEMOGLOBIN: 14.1 g/dL (ref 12.0–16.0)
MCH: 27.5 pg (ref 26.0–34.0)
MCHC: 33 g/dL (ref 32.0–36.0)
MCV: 83.3 fL (ref 80.0–100.0)
PLATELETS: 288 10*3/uL (ref 150–440)
RBC: 5.12 MIL/uL (ref 3.80–5.20)
RDW: 13.8 % (ref 11.5–14.5)
WBC: 5 10*3/uL (ref 3.6–11.0)

## 2016-04-29 LAB — URINALYSIS COMPLETE WITH MICROSCOPIC (ARMC ONLY)
BILIRUBIN URINE: NEGATIVE
Bacteria, UA: NONE SEEN
Glucose, UA: NEGATIVE mg/dL
KETONES UR: NEGATIVE mg/dL
Leukocytes, UA: NEGATIVE
NITRITE: NEGATIVE
PH: 7 (ref 5.0–8.0)
PROTEIN: NEGATIVE mg/dL
SPECIFIC GRAVITY, URINE: 1.014 (ref 1.005–1.030)
WBC UA: NONE SEEN WBC/hpf (ref 0–5)

## 2016-04-29 LAB — COMPREHENSIVE METABOLIC PANEL
ALK PHOS: 67 U/L (ref 38–126)
ALT: 12 U/L — ABNORMAL LOW (ref 14–54)
ANION GAP: 6 (ref 5–15)
AST: 12 U/L — ABNORMAL LOW (ref 15–41)
Albumin: 3.9 g/dL (ref 3.5–5.0)
BILIRUBIN TOTAL: 0.5 mg/dL (ref 0.3–1.2)
BUN: 7 mg/dL (ref 6–20)
CALCIUM: 8.7 mg/dL — AB (ref 8.9–10.3)
CO2: 28 mmol/L (ref 22–32)
CREATININE: 0.82 mg/dL (ref 0.44–1.00)
Chloride: 102 mmol/L (ref 101–111)
Glucose, Bld: 98 mg/dL (ref 65–99)
Potassium: 4 mmol/L (ref 3.5–5.1)
SODIUM: 136 mmol/L (ref 135–145)
TOTAL PROTEIN: 7.3 g/dL (ref 6.5–8.1)

## 2016-04-29 LAB — LIPASE, BLOOD: Lipase: 16 U/L (ref 11–51)

## 2016-04-29 LAB — POCT PREGNANCY, URINE: Preg Test, Ur: NEGATIVE

## 2016-04-29 MED ORDER — SODIUM CHLORIDE 0.9 % IV BOLUS (SEPSIS)
1000.0000 mL | Freq: Once | INTRAVENOUS | Status: AC
  Administered 2016-04-29: 1000 mL via INTRAVENOUS

## 2016-04-29 MED ORDER — PROCHLORPERAZINE EDISYLATE 5 MG/ML IJ SOLN
10.0000 mg | Freq: Once | INTRAMUSCULAR | Status: AC
  Administered 2016-04-29: 10 mg via INTRAVENOUS
  Filled 2016-04-29: qty 2

## 2016-04-29 MED ORDER — PROCHLORPERAZINE MALEATE 10 MG PO TABS: 10.0000 mg | ORAL_TABLET | Freq: Three times a day (TID) | ORAL | Status: DC | PRN

## 2016-04-29 MED ORDER — PROCHLORPERAZINE EDISYLATE 5 MG/ML IJ SOLN
INTRAMUSCULAR | Status: AC
  Filled 2016-04-29: qty 2

## 2016-04-29 NOTE — ED Provider Notes (Signed)
[facility] Emergency Department Provider Note  ____________________________________________  Time seen: ~1610  I have reviewed the triage vital signs and the nursing notes.   HISTORY  Chief Complaint Abdominal Pain and Headache   History limited by: Not Limited   HPI Danielle Pitts is a 40 y.o. female who presents to the emergency department today because of complaints for abdominal pain and headache. The abdominal pain is been going on for roughly 1 week. Somewhat diffuse. It has been accompanied by nausea and vomiting. A few days ago the patient started developing a migraine. She does have a history of migraines. She describes the pain as being a throbbing type sensation. It is located primarily top of her head. It is worse with bright lights. She denies any fevers.   Past Medical History  Diagnosis Date  . Asthma   . Anxiety   . Vertigo   . GERD (gastroesophageal reflux disease)     There are no active problems to display for this patient.   Past Surgical History  Procedure Laterality Date  . Abdominal hysterectomy    . Laproscopy      Current Outpatient Rx  Name  Route  Sig  Dispense  Refill  . acetaminophen (TYLENOL) 500 MG tablet   Oral   Take 1,000 mg by mouth every 6 (six) hours as needed for headache.         . albuterol (PROVENTIL HFA;VENTOLIN HFA) 108 (90 BASE) MCG/ACT inhaler   Inhalation   Inhale 2 puffs into the lungs every 6 (six) hours as needed for wheezing or shortness of breath.   1 Inhaler   2   . diphenhydrAMINE (BENADRYL) 25 MG tablet   Oral   Take 1 tablet (25 mg total) by mouth every 6 (six) hours.   20 tablet   0   . diphenhydramine-acetaminophen (TYLENOL PM) 25-500 MG TABS tablet   Oral   Take 2 tablets by mouth at bedtime as needed. For sleep         . metoCLOPramide (REGLAN) 10 MG tablet   Oral   Take 1 tablet (10 mg total) by mouth every 6 (six) hours.   30 tablet   0     Allergies Aspirin  and Percocet  Family History  Problem Relation Age of Onset  . Hypertension Other   . Diabetes Other   . Cancer Other     Social History Social History  Substance Use Topics  . Smoking status: Never Smoker   . Smokeless tobacco: None  . Alcohol Use: Yes     Comment: occ    Review of Systems  Constitutional: Negative for fever. Cardiovascular: Negative for chest pain. Respiratory: Negative for shortness of breath. Gastrointestinal: Positive for abdominal pain and nausea Neurological: Positive for headache   10-point ROS otherwise negative.  ____________________________________________   PHYSICAL EXAM:  VITAL SIGNS: ED Triage Vitals  Enc Vitals Group     BP 04/29/16 1432 149/80 mmHg     Pulse Rate 04/29/16 1432 70     Resp 04/29/16 1432 18     Temp 04/29/16 1432 98.2 F (36.8 C)     Temp Source 04/29/16 1432 Oral     SpO2 04/29/16 1432 99 %     Weight 04/29/16 1432 260 lb (117.935 kg)     Height 04/29/16 1432  (1.702 m)     Head Cir --      Peak Flow --      Pain  Score 04/29/16 1433 9   Constitutional: Alert and oriented. Well appearing and in no distress. Eyes: Conjunctivae are normal. PERRL. Normal extraocular movements. ENT   Head: Normocephalic and atraumatic.   Nose: No congestion/rhinnorhea.   Mouth/Throat: Mucous membranes are moist.   Neck: No stridor. Hematological/Lymphatic/Immunilogical: No cervical lymphadenopathy. Cardiovascular: Normal rate, regular rhythm.  No murmurs, rubs, or gallops. Respiratory: Normal respiratory effort without tachypnea nor retractions. Breath sounds are clear and equal bilaterally. No wheezes/rales/rhonchi. Gastrointestinal: Soft and nontender. No distention. There is no CVA tenderness. Genitourinary: Deferred Musculoskeletal: Normal range of motion in all extremities. No joint effusions.  No lower extremity tenderness nor edema. Neurologic:  Normal speech and language. No gross focal neurologic  deficits are appreciated.  Skin:  Skin is warm, dry and intact. No rash noted. Psychiatric: Mood and affect are normal. Speech and behavior are normal. Patient exhibits appropriate insight and judgment.  ____________________________________________    LABS (pertinent positives/negatives)  Labs Reviewed  COMPREHENSIVE METABOLIC PANEL - Abnormal; Notable for the following:    Calcium 8.7 (*)    AST 12 (*)    ALT 12 (*)    All other components within normal limits  URINALYSIS COMPLETEWITH MICROSCOPIC ([facility] ONLY) - Abnormal; Notable for the following:    Color, Urine YELLOW (*)    APPearance CLEAR (*)    Hgb urine dipstick 1+ (*)    Squamous Epithelial / LPF 0-5 (*)    All other components within normal limits  LIPASE, BLOOD  CBC  POCT PREGNANCY, URINE     ____________________________________________   EKG  None  ____________________________________________    RADIOLOGY  None  ____________________________________________   PROCEDURES  Procedure(s) performed: None  Critical Care performed: No  ____________________________________________   INITIAL IMPRESSION / ASSESSMENT AND PLAN / ED COURSE  Pertinent labs & imaging results that were available during my care of the patient were reviewed by me and considered in my medical decision making (see chart for details).  Patient presented to the emergency department today with primary concerns for abdominal pain and headache. On exam patient. Will. No focal neuro deficits. Patient was given fluids and medication. Patient stated her headache did improve. Blood work without any concerning findings. Will discharge with prescription for Compazine  ____________________________________________   FINAL CLINICAL IMPRESSION(S) / ED DIAGNOSES  Final diagnoses:  Headache, unspecified headache type  Abdominal pain, unspecified abdominal location     Note: This dictation was prepared with Dragon dictation. Any  transcriptional errors that result from this process are unintentional    Phineas SemenGraydon Barbi Kumagai, MD 04/29/16 2003

## 2016-04-29 NOTE — Discharge Instructions (Signed)
Please seek medical attention for any high fevers, chest pain, shortness of breath, change in behavior, persistent vomiting, bloody stool or any other new or concerning symptoms. ° ° °Migraine Headache °A migraine headache is very bad, throbbing pain on one or both sides of your head. Talk to your doctor about what things may bring on (trigger) your migraine headaches. °HOME CARE °· Only take medicines as told by your doctor. °· Lie down in a dark, quiet room when you have a migraine. °· Keep a journal to find out if certain things bring on migraine headaches. For example, write down: °¨ What you eat and drink. °¨ How much sleep you get. °¨ Any change to your diet or medicines. °· Lessen how much alcohol you drink. °· Quit smoking if you smoke. °· Get enough sleep. °· Lessen any stress in your life. °· Keep lights dim if bright lights bother you or make your migraines worse. °GET HELP RIGHT AWAY IF:  °· Your migraine becomes really bad. °· You have a fever. °· You have a stiff neck. °· You have trouble seeing. °· Your muscles are weak, or you lose muscle control. °· You lose your balance or have trouble walking. °· You feel like you will pass out (faint), or you pass out. °· You have really bad symptoms that are different than your first symptoms. °MAKE SURE YOU:  °· Understand these instructions. °· Will watch your condition. °· Will get help right away if you are not doing well or get worse. °  °This information is not intended to replace advice given to you by your health care provider. Make sure you discuss any questions you have with your health care provider. °  °Document Released: 07/09/2008 Document Revised: 12/23/2011 Document Reviewed: 06/07/2013 °Elsevier Interactive Patient Education ©2016 [employer] ° °

## 2016-04-29 NOTE — ED Notes (Signed)
States abd pain with nausea and vomiting for 1 week, states she last threw up a few days ago, states headache, pt awake and alert in no acute distress

## 2016-04-29 NOTE — ED Notes (Signed)
MD at bedside. 

## 2016-10-27 ENCOUNTER — Emergency Department
Admission: EM | Admit: 2016-10-27 | Discharge: 2016-10-27 | Disposition: A | Discharge status: Home / Self Care | Payer: Self-pay | Attending: Emergency Medicine | Admitting: Emergency Medicine

## 2016-10-27 ENCOUNTER — Encounter: Payer: Self-pay | Admitting: Emergency Medicine

## 2016-10-27 ENCOUNTER — Emergency Department: Payer: Self-pay

## 2016-10-27 DIAGNOSIS — J40 Bronchitis, not specified as acute or chronic: Secondary | ICD-10-CM | POA: Insufficient documentation

## 2016-10-27 DIAGNOSIS — Z7722 Contact with and (suspected) exposure to environmental tobacco smoke (acute) (chronic): Secondary | ICD-10-CM | POA: Insufficient documentation

## 2016-10-27 DIAGNOSIS — J069 Acute upper respiratory infection, unspecified: Secondary | ICD-10-CM | POA: Insufficient documentation

## 2016-10-27 LAB — INFLUENZA PANEL BY PCR (TYPE A & B)
INFLAPCR: NEGATIVE
INFLBPCR: NEGATIVE

## 2016-10-27 MED ORDER — KETOROLAC TROMETHAMINE 30 MG/ML IJ SOLN
30.0000 mg | Freq: Once | INTRAMUSCULAR | Status: AC
  Administered 2016-10-27: 30 mg via INTRAVENOUS

## 2016-10-27 MED ORDER — HYDROCOD POLST-CPM POLST ER 10-8 MG/5ML PO SUER: 5.0000 mL | Freq: Two times a day (BID) | ORAL | 0 refills | Status: DC

## 2016-10-27 MED ORDER — ONDANSETRON 4 MG PO TBDP: 4.0000 mg | ORAL_TABLET | Freq: Three times a day (TID) | ORAL | 0 refills | Status: DC | PRN

## 2016-10-27 MED ORDER — SODIUM CHLORIDE 0.9 % IV BOLUS (SEPSIS)
500.0000 mL | Freq: Once | INTRAVENOUS | Status: AC
Start: 2016-10-27 — End: 2016-10-27
  Administered 2016-10-27: 500 mL via INTRAVENOUS

## 2016-10-27 MED ORDER — BENZONATATE 100 MG PO CAPS
100.0000 mg | ORAL_CAPSULE | Freq: Once | ORAL | Status: AC
  Administered 2016-10-27: 100 mg via ORAL
  Filled 2016-10-27: qty 1

## 2016-10-27 MED ORDER — ONDANSETRON HCL 4 MG/2ML IJ SOLN
4.0000 mg | Freq: Once | INTRAMUSCULAR | Status: AC
  Administered 2016-10-27: 4 mg via INTRAVENOUS
  Filled 2016-10-27: qty 2

## 2016-10-27 MED ORDER — PREDNISONE 20 MG PO TABS
60.0000 mg | ORAL_TABLET | Freq: Every day | ORAL | 0 refills | Status: DC
Start: 2016-10-27 — End: 2018-12-17

## 2016-10-27 MED ORDER — TRAMADOL HCL 50 MG PO TABS
50.0000 mg | ORAL_TABLET | Freq: Once | ORAL | Status: AC
  Administered 2016-10-27: 50 mg via ORAL
  Filled 2016-10-27: qty 1

## 2016-10-27 MED ORDER — KETOROLAC TROMETHAMINE 60 MG/2ML IM SOLN
60.0000 mg | Freq: Once | INTRAMUSCULAR | Status: DC
  Filled 2016-10-27: qty 2

## 2016-10-27 MED ORDER — PREDNISONE 20 MG PO TABS
60.0000 mg | ORAL_TABLET | Freq: Once | ORAL | Status: AC
  Administered 2016-10-27: 60 mg via ORAL
  Filled 2016-10-27: qty 3

## 2016-10-27 NOTE — ED Notes (Signed)
Patient transported to X-ray 

## 2016-10-27 NOTE — ED Triage Notes (Signed)
Patient reports that she started having cough and body aches 2 days ago. Patient states that she has had cold chills but has not checked her temperature. Patient has tried taking OTC medications with no improvement.

## 2016-10-27 NOTE — ED Provider Notes (Signed)
[facility] Emergency Department Provider Note   ____________________________________________   First MD Initiated Contact with Patient 10/27/16 0424     (approximate)  I have reviewed the triage vital signs and the nursing notes.   HISTORY  Chief Complaint Generalized Body Aches and Cough    HPI Danielle Pitts is a 41 y.o. female who comes into the hospital today not feeling well. The patient reports that she's been coughing and having some vomiting and diarrhea with some body aches. She has some headache and burning in her chest when she coughs. She reports that the symptoms started 2 days ago with sneezing. She reports that she woke up yesterday feeling bad. Her head is congested. She's been taking cough and cold medicine as well as some daytime cold and flu medicine. She said that yesterday her chest felt tight and she was wheezing. 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 The patient thinks she might of had a fever yesterday but didn't have a thermometer to check her temperature. The patient decided to come in today for further evaluation.   Past Medical History:  Diagnosis Date  . Anxiety   . Asthma   . GERD (gastroesophageal reflux disease)   . Vertigo     There are no active problems to display for this patient.   Past Surgical History:  Procedure Laterality Date  . ABDOMINAL HYSTERECTOMY    . laproscopy      Prior to Admission medications   Medication Sig Start Date End Date Taking? Authorizing Provider  acetaminophen (TYLENOL) 500 MG tablet Take 1,000 mg by mouth every 6 (six) hours as needed for headache.    Historical Provider, MD  albuterol (PROVENTIL HFA;VENTOLIN HFA) 108 (90 BASE) MCG/ACT inhaler Inhale 2 puffs into the lungs every 6 (six) hours as needed for wheezing or shortness of breath. 09/21/15   Henrietta HooverLinda C Bernhardt, NP  chlorpheniramine-HYDROcodone  [facility](TUSSIONEX PENNKINETIC ER) 10-8 MG/5ML SUER Take 5 mLs by mouth 2 (two) times daily. 10/27/16   Rebecka ApleyAllison P Webster, MD  diphenhydrAMINE (BENADRYL) 25 MG tablet Take 1 tablet (25 mg total) by mouth every 6 (six) hours. 01/25/16   Arby BarretteMarcy Pfeiffer, MD  diphenhydramine-acetaminophen (TYLENOL PM) 25-500 MG TABS tablet Take 2 tablets by mouth at bedtime as needed. For sleep    Historical Provider, MD  metoCLOPramide (REGLAN) 10 MG tablet Take 1 tablet (10 mg total) by mouth every 6 (six) hours. 01/25/16   Arby BarretteMarcy Pfeiffer, MD  ondansetron (ZOFRAN ODT) 4 MG disintegrating tablet Take 1 tablet (4 mg total) by mouth every 8 (eight) hours as needed for nausea or vomiting. 10/27/16   Rebecka ApleyAllison P Webster, MD  predniSONE (DELTASONE) 20 MG tablet Take 3 tablets (60 mg total) by mouth daily. 10/27/16   Rebecka ApleyAllison P Webster, MD  prochlorperazine (COMPAZINE) 10 MG tablet Take 1 tablet (10 mg total) by mouth every 8 (eight) hours as needed (headache). 04/29/16   Phineas SemenGraydon Goodman, MD    Allergies Aspirin and Percocet [oxycodone-acetaminophen]  Family History  Problem Relation Age of Onset  . Hypertension Other   . Diabetes Other   . Cancer Other     Social History Social History  Substance Use Topics  . Smoking status: Passive Smoke Exposure - Never Smoker  . Smokeless tobacco: Never Used  . Alcohol use Yes     Comment: occ    Review of Systems Constitutional: No fever/chills Eyes: No visual changes.  ENT:  sore throat, ear pain Cardiovascular: Cough with chest pain. Respiratory: Cough and shortness of breath. Gastrointestinal:  abdominal pain.   nausea,  vomiting.  diarrhea.  No constipation. Genitourinary: Negative for dysuria. Musculoskeletal: Negative for back pain. Skin: Negative for rash. Neurological: Headache  10-point ROS otherwise negative.  ____________________________________________   PHYSICAL EXAM:  VITAL SIGNS: ED Triage Vitals  Enc Vitals Group     BP 10/27/16 0046 130/73     Pulse  Rate 10/27/16 0044 94     Resp 10/27/16 0044 18     Temp 10/27/16 0044 98.9 F (37.2 C)     Temp Source 10/27/16 0044 Oral     SpO2 10/27/16 0044 99 %     Weight 10/27/16 0045 287 lb (130.2 kg)     Height 10/27/16 0045 5\' 8"  (1.727 m)     Head Circumference --      Peak Flow --      Pain Score 10/27/16 0045 8     Pain Loc --      Pain Edu? --      Excl. in GC? --     Constitutional: Alert and oriented. Well appearing and in Mild distress. Eyes: Conjunctivae are normal. PERRL. EOMI.  Ears: TMs gray flattened dull without effusion or erythema Head: Atraumatic. Nose: No congestion/rhinnorhea. Mouth/Throat: Mucous membranes are moist.  Oropharynx non-erythematous. Cardiovascular: Normal rate, regular rhythm. Grossly normal heart sounds.  Good peripheral circulation. Respiratory: Normal respiratory effort.  No retractions. Lungs CTAB. Gastrointestinal: Soft and nontender. No distention. Positive bowel sounds Musculoskeletal: No lower extremity tenderness nor edema. Neurologic:  Normal speech and language.  Skin:  Skin is warm, dry and intact.  Psychiatric: Mood and affect are normal.   ____________________________________________   LABS (all labs ordered are listed, but only abnormal results are displayed)  Labs Reviewed  INFLUENZA PANEL BY PCR (TYPE A & B, H1N1)   ____________________________________________  EKG  none ____________________________________________  RADIOLOGY  CXR ____________________________________________   PROCEDURES  Procedure(s) performed: None  Procedures  Critical Care performed: No  ____________________________________________   INITIAL IMPRESSION / ASSESSMENT AND PLAN / ED COURSE  Pertinent labs & imaging results that were available during my care of the patient were reviewed by me and considered in my medical decision making (see chart for details).  This is a 41 year old female who comes into the hospital today not feeling  well. The patient has been having symptoms of a upper respiratory infection with cough cold as well as some body aches and feeling febrile. We did check a flu on the patient which was negative. The patient did not have any wheezing here but states that she felt like she was wheezing previously. We did a chest x-ray as well and the patient has no pneumonia. I did decide to give the patient a dose of benzonatate, prednisone, tramadol, Toradol and a 500 mL bolus of normal saline.  Clinical Course as of Oct 27 604  Wynelle Link Oct 27, 2016  1914 No active cardiopulmonary disease. DG Chest 2 View [AW]    Clinical Course User Index [AW] Rebecka Apley, MD   I will discharge the patient to home. I will also give the patient a work note.  ____________________________________________   FINAL CLINICAL IMPRESSION(S) / ED DIAGNOSES  Final diagnoses:  Viral upper respiratory tract infection  Bronchitis      NEW MEDICATIONS STARTED DURING THIS VISIT:  New Prescriptions   CHLORPHENIRAMINE-HYDROCODONE (TUSSIONEX PENNKINETIC ER) 10-8 MG/5ML SUER  Take 5 mLs by mouth 2 (two) times daily.   ONDANSETRON (ZOFRAN ODT) 4 MG DISINTEGRATING TABLET    Take 1 tablet (4 mg total) by mouth every 8 (eight) hours as needed for nausea or vomiting.   PREDNISONE (DELTASONE) 20 MG TABLET    Take 3 tablets (60 mg total) by mouth daily.     Note:  This document was prepared using Dragon voice recognition software and may include unintentional dictation errors.    Rebecka Apley, MD 10/27/16 [phone]

## 2016-10-27 NOTE — ED Notes (Signed)
Patient c/o generalized body aches, SOB, chest wall pain with cough, productive cough, nasal congestion/drainage X 2 days

## 2016-12-16 ENCOUNTER — Emergency Department: Payer: Self-pay

## 2016-12-16 ENCOUNTER — Encounter: Payer: Self-pay | Admitting: *Deleted

## 2016-12-16 ENCOUNTER — Emergency Department
Admission: EM | Admit: 2016-12-16 | Discharge: 2016-12-16 | Disposition: A | Discharge status: Home / Self Care | Payer: Self-pay | Attending: Emergency Medicine | Admitting: Emergency Medicine

## 2016-12-16 DIAGNOSIS — J45909 Unspecified asthma, uncomplicated: Secondary | ICD-10-CM | POA: Insufficient documentation

## 2016-12-16 DIAGNOSIS — Z7722 Contact with and (suspected) exposure to environmental tobacco smoke (acute) (chronic): Secondary | ICD-10-CM | POA: Insufficient documentation

## 2016-12-16 DIAGNOSIS — B9789 Other viral agents as the cause of diseases classified elsewhere: Secondary | ICD-10-CM

## 2016-12-16 DIAGNOSIS — J111 Influenza due to unidentified influenza virus with other respiratory manifestations: Secondary | ICD-10-CM

## 2016-12-16 DIAGNOSIS — Z79899 Other long term (current) drug therapy: Secondary | ICD-10-CM | POA: Insufficient documentation

## 2016-12-16 DIAGNOSIS — R69 Illness, unspecified: Secondary | ICD-10-CM

## 2016-12-16 DIAGNOSIS — J069 Acute upper respiratory infection, unspecified: Secondary | ICD-10-CM | POA: Insufficient documentation

## 2016-12-16 MED ORDER — HYDROCODONE-HOMATROPINE 5-1.5 MG/5ML PO SYRP: 5.0000 mL | ORAL_SOLUTION | Freq: Four times a day (QID) | ORAL | 0 refills | Status: DC | PRN

## 2016-12-16 NOTE — Discharge Instructions (Signed)

## 2016-12-16 NOTE — ED Provider Notes (Signed)
[facility] Emergency Department Provider Note  ____________________________________________   None    (approximate)  I have reviewed the triage vital signs and the nursing notes.   HISTORY  Chief Complaint Cough    HPI Danielle Pitts is a 41 y.o. female who presents for evaluation of relatively acute onset of influenza-like symptoms including body aches, general malaise, nasal congestion, runny nose, sneezing, and frequent cough.  She does not have shortness of breath or chest pain.  She also denies abdominal pain, nausea, vomiting, dysuria.  She has had no fever of which she is aware although she has had some chills.  She has had various aches and pains throughout her entire body.  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  She had it ED presentation about 2 months ago for similar symptoms as well.  She describes her symptoms as severe and nothing makes them better nor worse.  She has been able to eat and drink although she has had less appetite than usual.   Past Medical History:  Diagnosis Date  . Anxiety   . Asthma   . GERD (gastroesophageal reflux disease)   . Vertigo     There are no active problems to display for this patient.   Past Surgical History:  Procedure Laterality Date  . ABDOMINAL HYSTERECTOMY    . laproscopy      Prior to Admission medications   Medication Sig Start Date End Date Taking? Authorizing Provider  acetaminophen (TYLENOL) 500 MG tablet Take 1,000 mg by mouth every 6 (six) hours as needed for headache.    Historical Provider, MD  albuterol (PROVENTIL HFA;VENTOLIN HFA) 108 (90 BASE) MCG/ACT inhaler Inhale 2 puffs into the lungs every 6 (six) hours as needed for wheezing or shortness of breath. 09/21/15   Henrietta HooverLinda C Bernhardt, NP  chlorpheniramine-HYDROcodone [facility](TUSSIONEX PENNKINETIC ER) 10-8 MG/5ML SUER Take 5 mLs by mouth 2 (two) times  daily. 10/27/16   Rebecka ApleyAllison P Webster, MD  diphenhydrAMINE (BENADRYL) 25 MG tablet Take 1 tablet (25 mg total) by mouth every 6 (six) hours. 01/25/16   Arby BarretteMarcy Pfeiffer, MD  diphenhydramine-acetaminophen (TYLENOL PM) 25-500 MG TABS tablet Take 2 tablets by mouth at bedtime as needed. For sleep    Historical Provider, MD  HYDROcodone-homatropine (HYCODAN) 5-1.5 MG/5ML syrup Take 5 mLs by mouth every 6 (six) hours as needed for cough. 12/16/16   [name], MD  metoCLOPramide (REGLAN) 10 MG tablet Take 1 tablet (10 mg total) by mouth every 6 (six) hours. 01/25/16   Arby BarretteMarcy Pfeiffer, MD  ondansetron (ZOFRAN ODT) 4 MG disintegrating tablet Take 1 tablet (4 mg total) by mouth every 8 (eight) hours as needed for nausea or vomiting. 10/27/16   Rebecka ApleyAllison P Webster, MD  predniSONE (DELTASONE) 20 MG tablet Take 3 tablets (60 mg total) by mouth daily. 10/27/16   Rebecka ApleyAllison P Webster, MD  prochlorperazine (COMPAZINE) 10 MG tablet Take 1 tablet (10 mg total) by mouth every 8 (eight) hours as needed (headache). 04/29/16   Phineas SemenGraydon Goodman, MD    Allergies Aspirin and Percocet [oxycodone-acetaminophen]  Family History  Problem Relation Age of Onset  . Hypertension Other   . Diabetes Other   . Cancer Other     Social History Social History  Substance Use Topics  . Smoking status: Passive Smoke Exposure - Never Smoker  . Smokeless tobacco: Never Used  . Alcohol use  Yes     Comment: occ    Review of Systems Constitutional: No fever, +chills.  General malaise and myalgias Eyes: No visual changes. ENT: Mild sore throat.  +congestion Cardiovascular: Denies chest pain. Respiratory: Denies shortness of breath.  Frequent non-productive cough. Gastrointestinal: No abdominal pain.  No nausea, no vomiting.  No diarrhea.  No constipation. Genitourinary: Negative for dysuria. Musculoskeletal: Negative for back pain. Skin: Negative for rash. Neurological: Negative for headaches, focal weakness or numbness.  10-point ROS  otherwise negative.  ____________________________________________   PHYSICAL EXAM:  VITAL SIGNS: ED Triage Vitals  Enc Vitals Group     BP 12/16/16 2126 135/66     Pulse Rate 12/16/16 2126 91     Resp 12/16/16 2126 16     Temp 12/16/16 2126 98.5 F (36.9 C)     Temp Source 12/16/16 2126 Oral     SpO2 12/16/16 2126 98 %     Weight 12/16/16 2127 287 lb (130.2 kg)     Height 12/16/16 2127 5\' 7"  (1.702 m)     Head Circumference --      Peak Flow --      Pain Score 12/16/16 2128 7     Pain Loc --      Pain Edu? --      Excl. in GC? --     Constitutional: Alert and oriented. Appears ill with viral URI symptoms, but appears non-toxic Eyes: Conjunctivae are normal. PERRL. EOMI. Head: Atraumatic. Nose: No congestion/rhinnorhea. Mouth/Throat: Mucous membranes are moist. Neck: No stridor.  No meningeal signs.   Cardiovascular: Normal rate, regular rhythm. Good peripheral circulation. Grossly normal heart sounds. Respiratory: Normal respiratory effort.  No retractions. Lungs CTAB.  Frequent thick-sounding cough. Gastrointestinal: Soft and nontender. No distention.  Musculoskeletal: No lower extremity tenderness nor edema. No gross deformities of extremities. Neurologic:  Normal speech and language. No gross focal neurologic deficits are appreciated.  Skin:  Skin is warm, dry and intact. No rash noted. Psychiatric: Mood and affect are normal. Speech and behavior are normal.  ____________________________________________   LABS (all labs ordered are listed, but only abnormal results are displayed)  Labs Reviewed - No data to display ____________________________________________  EKG  None - EKG not ordered by ED physician ____________________________________________  RADIOLOGY   Dg Chest 2 View  Result Date: 12/16/2016 CLINICAL DATA:  Productive cough for 5 days. Sinus congestion, chest congestion and vomiting. Generalized body aches. EXAM: CHEST  2 VIEW COMPARISON:  Chest  radiograph October 27, 2016 FINDINGS: Cardiomediastinal silhouette is normal. No pleural effusions or focal consolidations. Trachea projects midline and there is no pneumothorax. Soft tissue planes and included osseous structures are non-suspicious. IMPRESSION: Normal chest. Electronically Signed   By: Awilda Metro M.D.   On: 12/16/2016 22:36    ____________________________________________   PROCEDURES  Procedure(s) performed:   Procedures   Critical Care performed: No ____________________________________________   INITIAL IMPRESSION / ASSESSMENT AND PLAN / ED COURSE  Pertinent labs & imaging results that were available during my care of the patient were reviewed by me and considered in my medical decision making (see chart for details).  The patient appears fairly uncomfortable for viral symptoms, but she is nontoxic with normal vital signs and afebrile.  Her chest x-ray is unremarkable.  Is no indication for lab work at this time and she has no other evidence of acute or emergent medical condition.  I reviewed with her my usual and customary management recommendations and return precautions for influenza-like illness and  she understands and agrees with the plan.  I will give her a prescription for cough medicine and I encouraged plenty of clear fluids to stay hydrated.  She is going to try to reestablish care at the [facility] clinic which she had previously and I also provided the phone number for the patient navigator if she needs a new PCP.  I will      ____________________________________________  FINAL CLINICAL IMPRESSION(S) / ED DIAGNOSES  Final diagnoses:  Viral URI with cough  Influenza-like illness     MEDICATIONS GIVEN DURING THIS VISIT:  Medications - No data to display   NEW OUTPATIENT MEDICATIONS STARTED DURING THIS VISIT:  Discharge Medication List as of 12/16/2016 10:52 PM    START taking these medications   Details  HYDROcodone-homatropine (HYCODAN)  5-1.5 MG/5ML syrup Take 5 mLs by mouth every 6 (six) hours as needed for cough., Starting Mon 12/16/2016, Print        Discharge Medication List as of 12/16/2016 10:52 PM      Discharge Medication List as of 12/16/2016 10:52 PM       Note:  This document was prepared using Dragon voice recognition software and may include unintentional dictation errors.    Loleta Rose, MD 12/16/16 2258

## 2016-12-16 NOTE — ED Triage Notes (Addendum)
Pt presents w/ intermittent hx of productive cough, headache that worsens w/ cough, sinus congestion, chest congestion, and vomiting x 1 yesterday after coughing until she gagged. Pt c/o generalized aches.

## 2017-05-22 IMAGING — DX DG CHEST 2V
2 series · 2 of 2 positions shown · non-contrast
Comparison: 09/26/2014

CLINICAL DATA: Onset of chest pain [REDACTED] at work, centrally
located radiating to arms and back, associated shortness of breath
and nausea, history asthma, GERD

EXAM:
CHEST  2 VIEW

[chest pa]
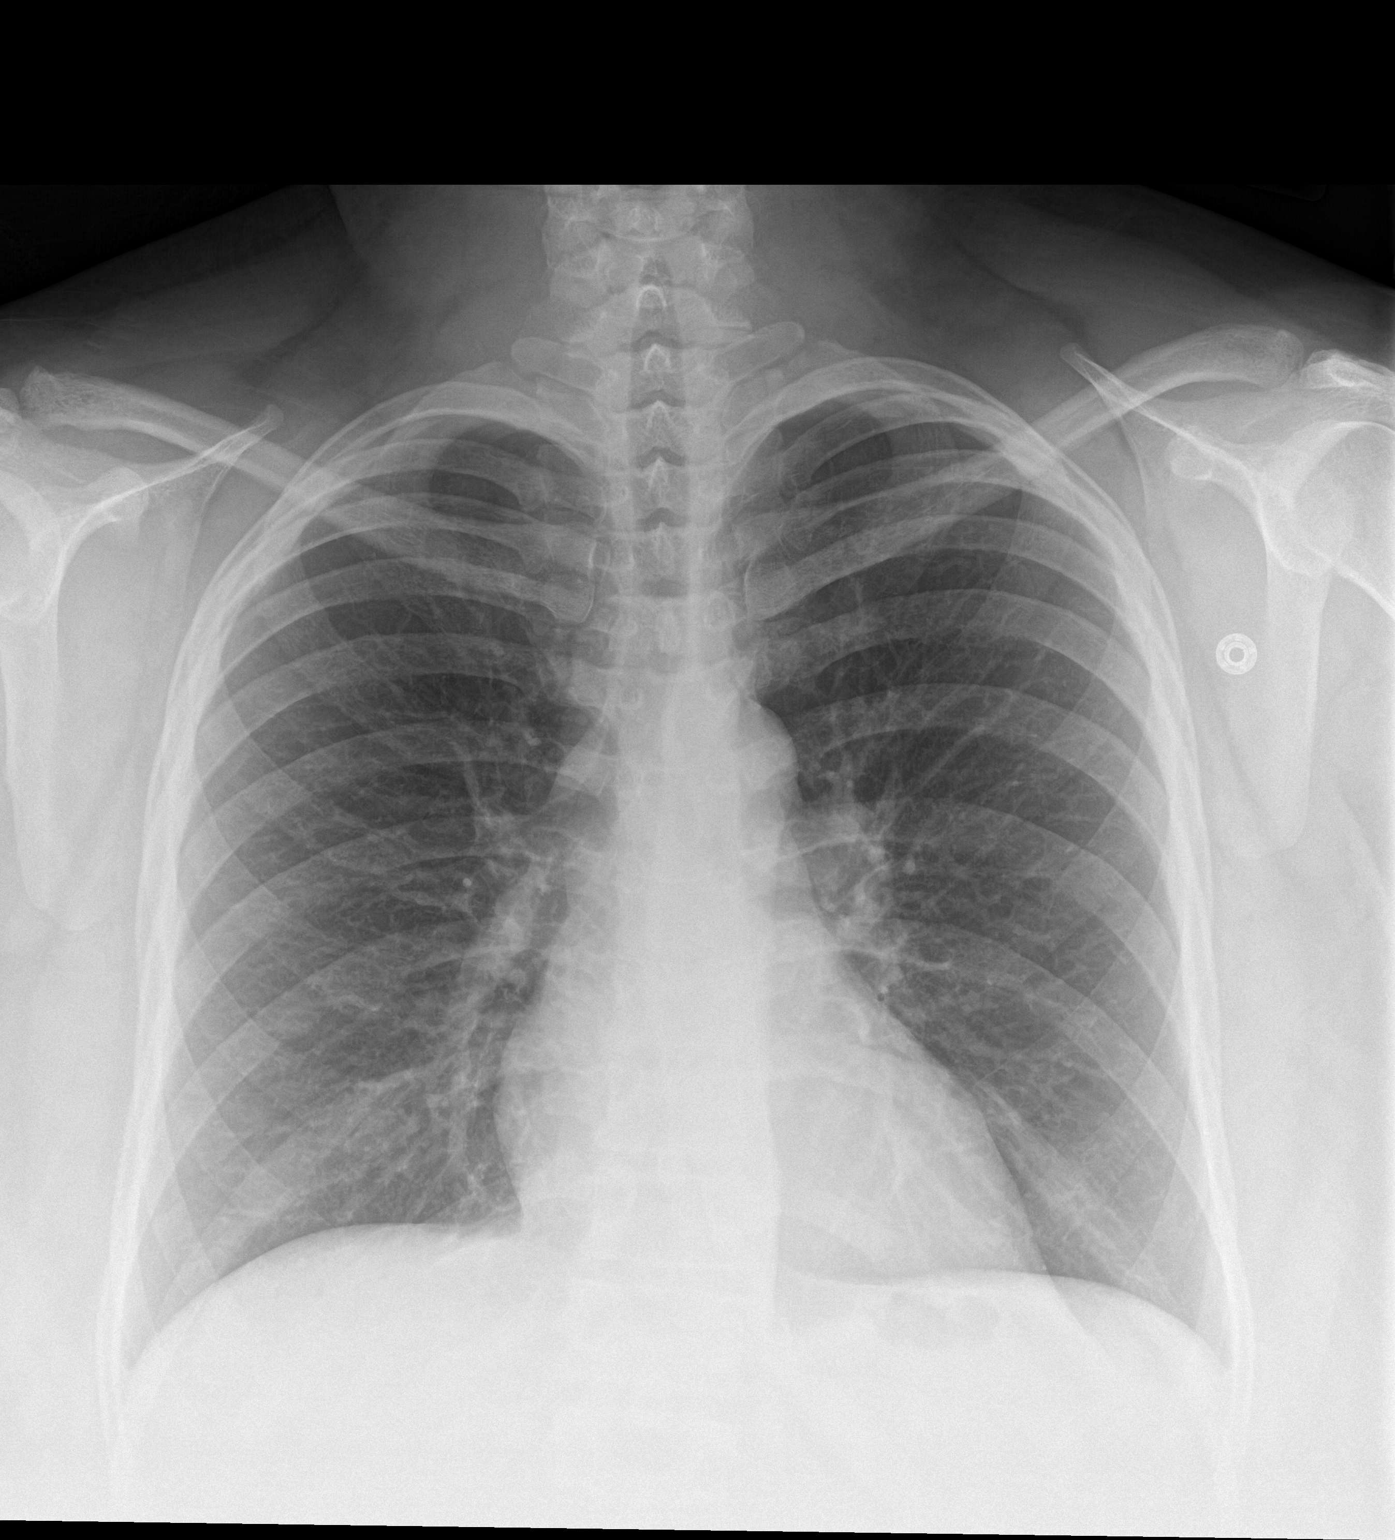

[chest lat]
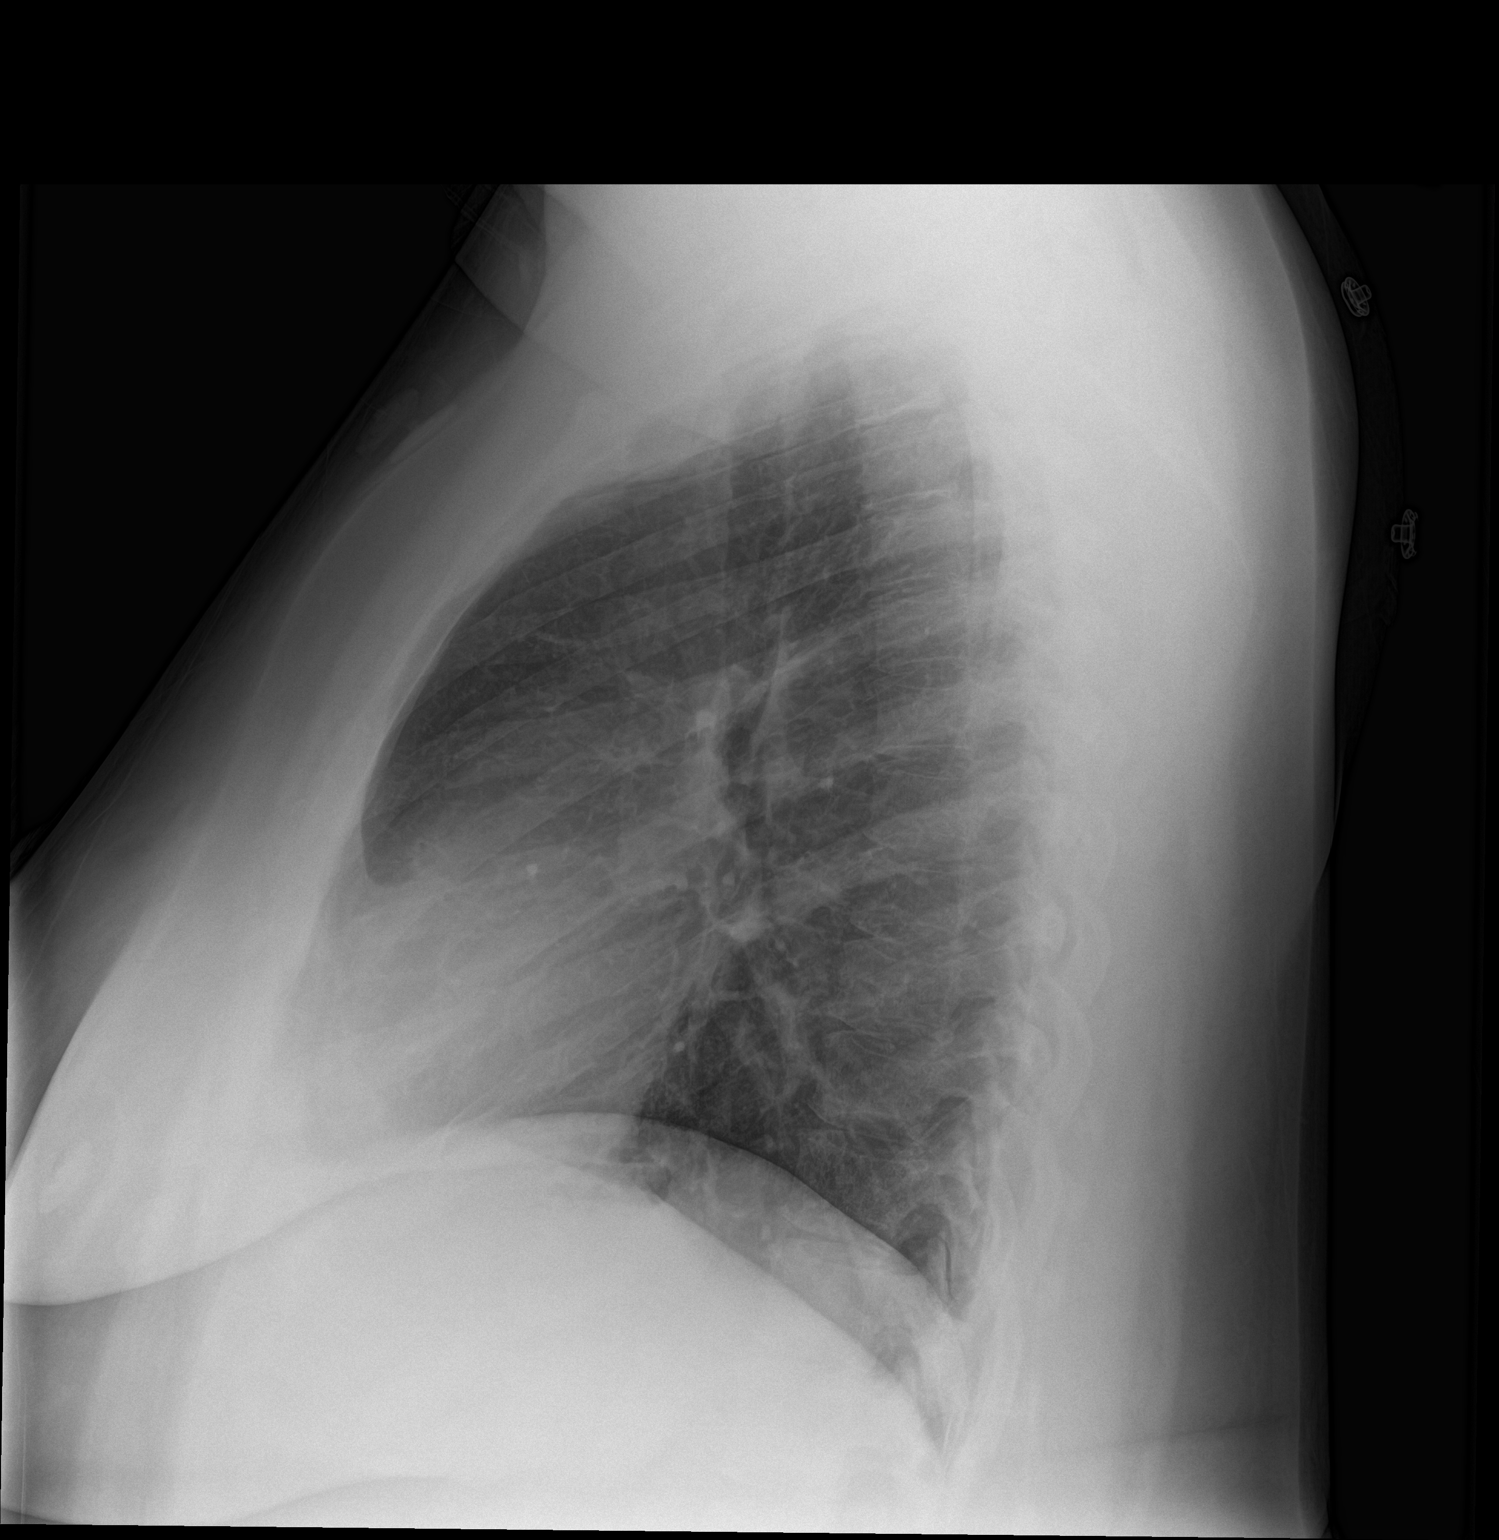

[2 of 2 positions shown; findings below may reference images not displayed]

FINDINGS: Normal heart size, mediastinal contours, and pulmonary vascularity.

Lungs clear.

No pneumothorax.

Bones unremarkable.
IMPRESSION: Normal exam.

## 2017-09-24 ENCOUNTER — Other Ambulatory Visit: Payer: Self-pay

## 2017-09-24 ENCOUNTER — Emergency Department
Admission: EM | Admit: 2017-09-24 | Discharge: 2017-09-24 | Disposition: A | Discharge status: Home / Self Care | Payer: Commercial Managed Care - HMO | Attending: Emergency Medicine | Admitting: Emergency Medicine

## 2017-09-24 DIAGNOSIS — G43909 Migraine, unspecified, not intractable, without status migrainosus: Secondary | ICD-10-CM | POA: Insufficient documentation

## 2017-09-24 DIAGNOSIS — Z79899 Other long term (current) drug therapy: Secondary | ICD-10-CM | POA: Insufficient documentation

## 2017-09-24 DIAGNOSIS — J45909 Unspecified asthma, uncomplicated: Secondary | ICD-10-CM | POA: Diagnosis not present

## 2017-09-24 DIAGNOSIS — R51 Headache: Secondary | ICD-10-CM | POA: Diagnosis present

## 2017-09-24 DIAGNOSIS — Z7722 Contact with and (suspected) exposure to environmental tobacco smoke (acute) (chronic): Secondary | ICD-10-CM | POA: Insufficient documentation

## 2017-09-24 DIAGNOSIS — F419 Anxiety disorder, unspecified: Secondary | ICD-10-CM | POA: Diagnosis not present

## 2017-09-24 HISTORY — DX: Migraine, unspecified, not intractable, without status migrainosus: G43.909

## 2017-09-24 MED ORDER — DEXAMETHASONE SODIUM PHOSPHATE 10 MG/ML IJ SOLN
10.0000 mg | Freq: Once | INTRAMUSCULAR | Status: AC
  Administered 2017-09-24: 10 mg via INTRAVENOUS
  Filled 2017-09-24: qty 1

## 2017-09-24 MED ORDER — DIPHENHYDRAMINE HCL 50 MG/ML IJ SOLN
25.0000 mg | INTRAMUSCULAR | Status: AC
  Administered 2017-09-24: 25 mg via INTRAVENOUS
  Filled 2017-09-24: qty 1

## 2017-09-24 MED ORDER — SODIUM CHLORIDE 0.9 % IV BOLUS (SEPSIS)
500.0000 mL | INTRAVENOUS | Status: AC
  Administered 2017-09-24: 500 mL via INTRAVENOUS

## 2017-09-24 MED ORDER — METOCLOPRAMIDE HCL 5 MG/ML IJ SOLN
10.0000 mg | INTRAMUSCULAR | Status: AC
  Administered 2017-09-24: 10 mg via INTRAVENOUS
  Filled 2017-09-24: qty 2

## 2017-09-24 MED ORDER — MAGNESIUM SULFATE 2 GM/50ML IV SOLN
2.0000 g | Freq: Once | INTRAVENOUS | Status: AC
  Administered 2017-09-24: 2 g via INTRAVENOUS
  Filled 2017-09-24: qty 50

## 2017-09-24 MED ORDER — KETOROLAC TROMETHAMINE 30 MG/ML IJ SOLN
15.0000 mg | Freq: Once | INTRAMUSCULAR | Status: AC
  Administered 2017-09-24: 15 mg via INTRAVENOUS
  Filled 2017-09-24: qty 1

## 2017-09-24 NOTE — ED Provider Notes (Signed)
[facility] Emergency Department Provider Note  ____________________________________________   First MD Initiated Contact with Patient 09/24/17 1342     (approximate)  I have reviewed the triage vital signs and the nursing notes.   HISTORY  Chief Complaint Headache    HPI Danielle Pitts is a 41 y.o. 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  She was seen in [place] clinic 5 days ago and given an unspecified injection as well as a prescription for naproxen and hydrocodone.  She states that it helped a little bit but it did not make the headache go away.  They told her to come back today if the symptoms had not improved but when she came back today she did not have a co-pay and was turned away so she came to the emergency department.  She reports that she is very sensitive to light and has been very nauseated with several episodes of emesis.  Her pain is global and throbbing and severe.  She denies fever/chills, neck pain, neck stiffness, chest pain, shortness of breath, abdominal pain, and dysuria.  She reports that it does feel similar to her prior migraines except this time she does not have any weakness or facial droop as she had in the past when she was evaluated at [facility] in the emergency department by neurology and with MRIs and diagnosed with complex hemiplegic migraines.  Of note she reports that she has been working 70+ hours a week recently and additionally she had a family member died in an automobile accident last week.  She thinks that the additional work stress and the stress of the loss of family member has contributed to the onset and persistence of her migraine.  Past Medical History:  Diagnosis Date  . Anxiety   . Asthma   . GERD (gastroesophageal reflux disease)   . Migraines    complex and hemiplegic, evaluated in 2017  by neurology and with MRIs, all reassuring  . Vertigo     There are no active problems to display for this patient.   Past Surgical History:  Procedure Laterality Date  . ABDOMINAL HYSTERECTOMY    . laproscopy      Prior to Admission medications   Medication Sig Start Date End Date Taking? Authorizing Provider  acetaminophen (TYLENOL) 500 MG tablet Take 1,000 mg by mouth every 6 (six) hours as needed for headache.    [provider]  albuterol (PROVENTIL HFA;VENTOLIN HFA) 108 (90 BASE) MCG/ACT inhaler Inhale 2 puffs into the lungs every 6 (six) hours as needed for wheezing or shortness of breath. 09/21/15   Henrietta Hoover, NP  chlorpheniramine-HYDROcodone (TUSSIONEX PENNKINETIC ER) 10-8 MG/5ML SUER Take 5 mLs by mouth 2 (two) times daily. 10/27/16   Rebecka Apley, MD  diphenhydrAMINE (BENADRYL) 25 MG tablet Take 1 tablet (25 mg total) by mouth every 6 (six) hours. 01/25/16   Arby Barrette, MD  diphenhydramine-acetaminophen (TYLENOL PM) 25-500 MG TABS tablet Take 2 tablets by mouth at bedtime as needed. For sleep    [provider]  HYDROcodone-homatropine (HYCODAN) 5-1.5 MG/5ML syrup Take 5 mLs by mouth every 6 (six) hours as needed for cough. 12/16/16   Loleta Rose, MD  metoCLOPramide (REGLAN) 10 MG tablet Take 1 tablet (10 mg total) by mouth every 6 (six) hours. 01/25/16   Arby Barrette, MD  ondansetron (ZOFRAN ODT) 4 MG  disintegrating tablet Take 1 tablet (4 mg total) by mouth every 8 (eight) hours as needed for nausea or vomiting. 10/27/16   Rebecka ApleyWebster, Allison P, MD  predniSONE (DELTASONE) 20 MG tablet Take 3 tablets (60 mg total) by mouth daily. 10/27/16   Rebecka ApleyWebster, Allison P, MD  prochlorperazine (COMPAZINE) 10 MG tablet Take 1 tablet (10 mg total) by mouth every 8 (eight) hours as needed (headache). 04/29/16   Phineas SemenGoodman, Graydon, MD    Allergies Aspirin and Percocet [oxycodone-acetaminophen]  Family History  Problem Relation Age of Onset  . Hypertension Other    . Diabetes Other   . Cancer Other     Social History Social History   Tobacco Use  . Smoking status: Passive Smoke Exposure - Never Smoker  . Smokeless tobacco: Never Used  Substance Use Topics  . Alcohol use: Yes    Comment: occ  . Drug use: No    Review of Systems Constitutional: No fever/chills Eyes: No visual changes other than severe photophobia. ENT: No sore throat. Cardiovascular: Denies chest pain. Respiratory: Denies shortness of breath. Gastrointestinal: No abdominal pain.  No nausea, no vomiting.  No diarrhea.  No constipation. Genitourinary: Negative for dysuria. Musculoskeletal: Negative for neck pain.  Negative for back pain. Integumentary: Negative for rash. Neurological: Negative for headaches, focal weakness or numbness.   ____________________________________________   PHYSICAL EXAM:  VITAL SIGNS: ED Triage Vitals  Enc Vitals Group     BP 09/24/17 1219 115/90     Pulse Rate 09/24/17 1219 74     Resp 09/24/17 1219 18     Temp 09/24/17 1219 98.6 F (37 C)     Temp Source 09/24/17 1219 Oral     SpO2 09/24/17 1219 98 %     Weight 09/24/17 1219 126.1 kg (278 lb)     Height 09/24/17 1219 1.702 m (5\' 7" )     Head Circumference --      Peak Flow --      Pain Score 09/24/17 1218 8     Pain Loc --      Pain Edu? --      Excl. in GC? --     Constitutional: Alert and oriented.  Nontoxic but does appear very uncomfortable and is shielding her eyes Eyes: Conjunctivae are normal. PERRL. EOMI. +Photophobia Head: Atraumatic. Nose: No congestion/rhinnorhea. Mouth/Throat: Mucous membranes are moist. Neck: No stridor.  No meningeal signs.   Cardiovascular: Normal rate, regular rhythm. Good peripheral circulation. Grossly normal heart sounds. Respiratory: Normal respiratory effort.  No retractions. Lungs CTAB. Gastrointestinal: Soft and nontender. No distention.  Musculoskeletal: No lower extremity tenderness nor edema. No gross deformities of  extremities. Neurologic:  Normal speech and language. No gross focal neurologic deficits are appreciated.  Skin:  Skin is warm, dry and intact. No rash noted. Psychiatric: Mood and affect are normal. Speech and behavior are normal.  ____________________________________________   LABS (all labs ordered are listed, but only abnormal results are displayed)  Labs Reviewed - No data to display ____________________________________________  EKG  None - EKG not ordered by ED physician ____________________________________________  RADIOLOGY   No results found.  ____________________________________________   PROCEDURES  Critical Care performed: No   Procedure(s) performed:   Procedures   ____________________________________________   INITIAL IMPRESSION / ASSESSMENT AND PLAN / ED COURSE  As part of my medical decision making, I reviewed the following data within the electronic MEDICAL RECORD NUMBER Nursing notes reviewed and incorporated and Old chart reviewed    Differential diagnosis includes,  but is not limited to, intracranial hemorrhage, meningitis/encephalitis, previous head trauma, cavernous venous thrombosis, tension headache, temporal arteritis, migraine or migraine equivalent, idiopathic intracranial hypertension, and non-specific headache.  However I did review the medical record and the patient has a clearly documented history of migraines.  As mentioned above [facility] strokelike symptoms and had normal MRIs and was evaluated by neurology.  The diagnosis was complex hemiplegic migraines.  Her symptoms are less severe than that this time but she states that the pain feels similar to prior.  I feel it is appropriate to not obtain any imaging or lab work at this time but to treat aggressively with my usual "migraine cocktail" of metoclopramide, Benadryl, Toradol (she has an allergy listed to aspirin but she reports that she takes naproxen without  any issues), Decadron, IV fluids, and magnesium 2 g IV.  Anticipate this will significantly alleviate her symptoms, but I will reassess to make sure she is improving and does not require  Imaging or additional workup today.  I shared with her my plan and she understands and agrees. Clinical Course as of Sep 24 1648  Wed Sep 24, 2017  1522 Reports that the pain and pressure are much better although not completely resolved.  Gave her the option of some additional medication but she prefers to go home and said that she feels much better that she did before and thanked me for the assistance.  I gave my usual customary return precautions.  [CF]    Clinical Course User Index [CF] Loleta RoseForbach, Oluwatosin Bracy, MD    ____________________________________________  FINAL CLINICAL IMPRESSION(S) / ED DIAGNOSES  Final diagnoses:  Migraine without status migrainosus, not intractable, unspecified migraine type     MEDICATIONS GIVEN DURING THIS VISIT:  Medications  sodium chloride 0.9 % bolus 500 mL (not administered)  metoCLOPramide (REGLAN) injection 10 mg (not administered)  diphenhydrAMINE (BENADRYL) injection 25 mg (not administered)  ketorolac (TORADOL) 30 MG/ML injection 15 mg (not administered)  dexamethasone (DECADRON) injection 10 mg (not administered)  magnesium sulfate IVPB 2 g 50 mL (not administered)     ED Discharge Orders    None       Note:  This document was prepared using Dragon voice recognition software and may include unintentional dictation errors.    Loleta RoseForbach, Gaetano Romberger, MD 09/24/17 [phone]

## 2017-09-24 NOTE — ED Notes (Signed)
Pt c/o migraine since Friday and [facility] , states they gave her injections for the pain and Rx naproxen and hydrocodone states last toke at 12am last night. States her sx have not improved and [facility] they refused to see her unless she paid $50.

## 2017-09-24 NOTE — ED Notes (Signed)
Pt alert and oriented X4, active, cooperative, pt in NAD. RR even and unlabored, color WNL.  Pt informed to return if any life threatening symptoms occur.  Discharge and followup instructions reviewed. Unable to obtain signature due to computer freeze.

## 2017-09-24 NOTE — Discharge Instructions (Signed)

## 2017-09-24 NOTE — ED Triage Notes (Signed)
Pt to ER via POV c/o "migraine" since Friday. [facility] and presscribed medication; no better. Pt [facility], turned away due to having no money for copay. Pt reports photosensitivity. Tearful. Pt alert and oriented X4, active, cooperative, pt in NAD. RR even and unlabored, color WNL.

## 2017-10-11 ENCOUNTER — Emergency Department: Payer: Worker's Compensation

## 2017-10-11 ENCOUNTER — Encounter: Payer: Self-pay | Admitting: Emergency Medicine

## 2017-10-11 ENCOUNTER — Other Ambulatory Visit: Payer: Self-pay

## 2017-10-11 DIAGNOSIS — Y9301 Activity, walking, marching and hiking: Secondary | ICD-10-CM | POA: Diagnosis not present

## 2017-10-11 DIAGNOSIS — Y929 Unspecified place or not applicable: Secondary | ICD-10-CM | POA: Insufficient documentation

## 2017-10-11 DIAGNOSIS — Y999 Unspecified external cause status: Secondary | ICD-10-CM | POA: Insufficient documentation

## 2017-10-11 DIAGNOSIS — S83511A Sprain of anterior cruciate ligament of right knee, initial encounter: Secondary | ICD-10-CM | POA: Insufficient documentation

## 2017-10-11 DIAGNOSIS — Z7722 Contact with and (suspected) exposure to environmental tobacco smoke (acute) (chronic): Secondary | ICD-10-CM | POA: Diagnosis not present

## 2017-10-11 DIAGNOSIS — W010XXA Fall on same level from slipping, tripping and stumbling without subsequent striking against object, initial encounter: Secondary | ICD-10-CM | POA: Insufficient documentation

## 2017-10-11 DIAGNOSIS — S82111A Displaced fracture of right tibial spine, initial encounter for closed fracture: Secondary | ICD-10-CM | POA: Diagnosis not present

## 2017-10-11 DIAGNOSIS — S8991XA Unspecified injury of right lower leg, initial encounter: Secondary | ICD-10-CM | POA: Diagnosis present

## 2017-10-11 DIAGNOSIS — Z79899 Other long term (current) drug therapy: Secondary | ICD-10-CM | POA: Diagnosis not present

## 2017-10-11 DIAGNOSIS — J45909 Unspecified asthma, uncomplicated: Secondary | ICD-10-CM | POA: Insufficient documentation

## 2017-10-11 NOTE — ED Triage Notes (Signed)
Pt arrives POV to triage with c/o right knee pain. Pt was getting off work last night and slid into the side of her car on the grass. Pt denies head trauma or LOC. Pt is in NAD.

## 2017-10-12 ENCOUNTER — Emergency Department
Admission: EM | Admit: 2017-10-12 | Discharge: 2017-10-12 | Disposition: A | Discharge status: Home / Self Care | Payer: Worker's Compensation | Attending: Emergency Medicine | Admitting: Emergency Medicine

## 2017-10-12 DIAGNOSIS — S83511A Sprain of anterior cruciate ligament of right knee, initial encounter: Secondary | ICD-10-CM

## 2017-10-12 DIAGNOSIS — S82111A Displaced fracture of right tibial spine, initial encounter for closed fracture: Secondary | ICD-10-CM

## 2017-10-12 NOTE — ED Provider Notes (Signed)
[facility] Emergency Department Provider Note  ____________________________________________   First MD Initiated Contact with Patient 10/12/17 0045     (approximate)  I have reviewed the triage vital signs and the nursing notes.   HISTORY  Chief Complaint Knee Pain   HPI Danielle Pitts is a 41 y.o. female who self presents to the emergency department with roughly 24 hours of sudden onset moderate to severe aching throbbing discomfort in her right knee after slipping on grass and falling to the ground.  She did not hear a pop.  She has been able to ambulate.  Her pain radiates from her knee to her toes.  It is worse with movement improved with rest.  Past Medical History:  Diagnosis Date  . Anxiety   . Asthma   . GERD (gastroesophageal reflux disease)   . Migraines    complex and hemiplegic, evaluated in 2017 by neurology and with MRIs, all reassuring  . Vertigo     There are no active problems to display for this patient.   Past Surgical History:  Procedure Laterality Date  . ABDOMINAL HYSTERECTOMY    . laproscopy      Prior to Admission medications   Medication Sig Start Date End Date Taking? Authorizing Provider  acetaminophen (TYLENOL) 500 MG tablet Take 1,000 mg by mouth every 6 (six) hours as needed for headache.    [provider]  albuterol (PROVENTIL HFA;VENTOLIN HFA) 108 (90 BASE) MCG/ACT inhaler Inhale 2 puffs into the lungs every 6 (six) hours as needed for wheezing or shortness of breath. 09/21/15   Henrietta HooverBernhardt, Linda C, NP  chlorpheniramine-HYDROcodone (TUSSIONEX PENNKINETIC ER) 10-8 MG/5ML SUER Take 5 mLs by mouth 2 (two) times daily. 10/27/16   Rebecka ApleyWebster, Allison P, MD  diphenhydrAMINE (BENADRYL) 25 MG tablet Take 1 tablet (25 mg total) by mouth every 6 (six) hours. 01/25/16   Arby BarrettePfeiffer, Marcy, MD  diphenhydramine-acetaminophen (TYLENOL PM) 25-500 MG TABS tablet Take 2 tablets by mouth at bedtime as needed. For sleep     [provider]  HYDROcodone-homatropine (HYCODAN) 5-1.5 MG/5ML syrup Take 5 mLs by mouth every 6 (six) hours as needed for cough. 12/16/16   Loleta RoseForbach, Cory, MD  metoCLOPramide (REGLAN) 10 MG tablet Take 1 tablet (10 mg total) by mouth every 6 (six) hours. 01/25/16   Arby BarrettePfeiffer, Marcy, MD  ondansetron (ZOFRAN ODT) 4 MG disintegrating tablet Take 1 tablet (4 mg total) by mouth every 8 (eight) hours as needed for nausea or vomiting. 10/27/16   Rebecka ApleyWebster, Allison P, MD  predniSONE (DELTASONE) 20 MG tablet Take 3 tablets (60 mg total) by mouth daily. 10/27/16   Rebecka ApleyWebster, Allison P, MD  prochlorperazine (COMPAZINE) 10 MG tablet Take 1 tablet (10 mg total) by mouth every 8 (eight) hours as needed (headache). 04/29/16   Phineas SemenGoodman, Graydon, MD    Allergies Aspirin and Percocet [oxycodone-acetaminophen]  Family History  Problem Relation Age of Onset  . Hypertension Other   . Diabetes Other   . Cancer Other     Social History Social History   Tobacco Use  . Smoking status: Passive Smoke Exposure - Never Smoker  . Smokeless tobacco: Never Used  Substance Use Topics  . Alcohol use: Yes    Comment: occ  . Drug use: No    Review of Systems Constitutional: No fever/chills ENT: No sore throat. Cardiovascular: Denies chest pain. Respiratory: Denies shortness of breath. Gastrointestinal: No abdominal pain.  No nausea, no vomiting.  No diarrhea.  No constipation. Musculoskeletal:  Negative for back pain. Neurological: Negative for headaches   ____________________________________________   PHYSICAL EXAM:  VITAL SIGNS: ED Triage Vitals  Enc Vitals Group     BP 10/11/17 2218 (!) 125/55     Pulse Rate 10/11/17 2218 95     Resp 10/11/17 2218 16     Temp 10/11/17 2218 98.9 F (37.2 C)     Temp Source 10/11/17 2218 Oral     SpO2 10/11/17 2218 99 %     Weight 10/11/17 2219 280 lb (127 kg)     Height 10/11/17 2219 5\' 7"  (1.702 m)     Head Circumference --      Peak Flow --      Pain Score  10/11/17 2227 10     Pain Loc --      Pain Edu? --      Excl. in GC? --     Constitutional: Alert and oriented x4 appears uncomfortable nontoxic no diaphoresis speaks full clear sentences Head: Atraumatic. Nose: No congestion/rhinnorhea. Mouth/Throat: No trismus Neck: No stridor.   Cardiovascular: Regular rate and rhythm Respiratory: Normal respiratory effort.  No retractions. MSK: Right knee mild effusion extensor mechanism and grossly stable Neurologic:  Normal speech and language. No gross focal neurologic deficits are appreciated.  Skin:  Skin is warm, dry and intact. No rash noted.    ____________________________________________  LABS (all labs ordered are listed, but only abnormal results are displayed)  Labs Reviewed - No data to display   __________________________________________  EKG   ____________________________________________  RADIOLOGY  X-ray reviewed by me shows fracture to the tibial spine on the right ____________________________________________   DIFFERENTIAL includes but not limited to  Knee dislocation, patellar dislocation, tibial plateau fracture, internal derangement of the knee   PROCEDURES  Procedure(s) performed: no  Procedures  Critical Care performed: no  Observation: no ____________________________________________   INITIAL IMPRESSION / ASSESSMENT AND PLAN / ED COURSE  Pertinent labs & imaging results that were available during my care of the patient were reviewed by me and considered in my medical decision making (see chart for details).  The patient is able to ambulate.  Her x-ray shows a tibial spine fracture and exam which is concerning for an ACL injury.  I will refer her to orthopedic surgery as an outpatient for further evaluation and treatment.  The patient verbalizes understanding and agree with the plan.      ____________________________________________   FINAL CLINICAL IMPRESSION(S) / ED DIAGNOSES  Final  diagnoses:  Displaced fracture of right tibial spine, initial encounter for closed fracture  Rupture of anterior cruciate ligament of right knee, initial encounter      NEW MEDICATIONS STARTED DURING THIS VISIT:  This SmartLink is deprecated. Use AVSMEDLIST instead to display the medication list for a patient.   Note:  This document was prepared using Dragon voice recognition software and may include unintentional dictation errors.      Merrily Brittleifenbark, Jann Ra, MD 10/12/17 [phone]

## 2017-10-12 NOTE — Discharge Instructions (Addendum)
Please wear a knee brace at all times until you are able to follow-up with the orthopedic surgeon this coming week.  Return to the emergency department sooner for any concerns whatsoever.  It was a pleasure to take care of you today, and thank you for coming to our emergency department.  If you have any questions or concerns before leaving please ask the nurse to grab me and I'm more than happy to go through your aftercare instructions again.  If you were prescribed any opioid pain medication today such as Norco, Vicodin, Percocet, morphine, hydrocodone, or oxycodone please make sure you do not drive when you are taking this medication as it can alter your ability to drive safely.  If you have any concerns once you are home that you are not improving or are in fact getting worse before you can make it to your follow-up appointment, please do not hesitate to call [phone] and come back for further evaluation.  Merrily BrittleNeil Kynzley Dowson, MD  Results for orders placed or performed during the hospital encounter of 10/27/16  Influenza panel by PCR (type A & B, H1N1)  Result Value Ref Range   Influenza A By PCR NEGATIVE NEGATIVE   Influenza B By PCR NEGATIVE NEGATIVE   Dg Knee Complete 4 Views Right  Result Date: 10/11/2017 CLINICAL DATA:  Acute onset of right knee pain after fall. Initial encounter. EXAM: RIGHT KNEE - COMPLETE 4+ VIEW COMPARISON:  Right femur radiographs performed 02/09/2009 FINDINGS: A small osseous fragment at the medial aspect of the tibial spine may reflect a small avulsion fracture. Would correlate for any clinical signs of cruciate ligament tear. No significant knee joint effusion is seen. The patellofemoral compartment is grossly unremarkable. Mild marginal osteophyte formation is noted at the medial compartment. No significant joint space narrowing is appreciated. Mild soft tissue swelling is noted overlying the patellar tendon. IMPRESSION: Small osseous fragment at the medial aspect of the  tibial spine may reflect a small avulsion fracture. Would correlate for any clinical signs of cruciate ligament tear. If the patient's symptoms persist, MRI of the right knee could be considered to assess for underlying internal derangement. Electronically Signed   By: Roanna RaiderJeffery  Chang M.D.   On: 10/11/2017 23:07

## 2017-10-12 NOTE — ED Notes (Signed)
Esign not working, pt verbalized discharge instructions and has no questions at this time 

## 2018-07-21 ENCOUNTER — Emergency Department
Admission: EM | Admit: 2018-07-21 | Discharge: 2018-07-21 | Disposition: A | Discharge status: Home / Self Care | Payer: 59 | Attending: Emergency Medicine | Admitting: Emergency Medicine

## 2018-07-21 ENCOUNTER — Encounter: Payer: Self-pay | Admitting: Emergency Medicine

## 2018-07-21 DIAGNOSIS — Z7722 Contact with and (suspected) exposure to environmental tobacco smoke (acute) (chronic): Secondary | ICD-10-CM | POA: Insufficient documentation

## 2018-07-21 DIAGNOSIS — J45909 Unspecified asthma, uncomplicated: Secondary | ICD-10-CM | POA: Insufficient documentation

## 2018-07-21 DIAGNOSIS — R1030 Lower abdominal pain, unspecified: Secondary | ICD-10-CM | POA: Insufficient documentation

## 2018-07-21 DIAGNOSIS — Z79899 Other long term (current) drug therapy: Secondary | ICD-10-CM | POA: Insufficient documentation

## 2018-07-21 LAB — URINALYSIS, COMPLETE (UACMP) WITH MICROSCOPIC
BACTERIA UA: NONE SEEN
BILIRUBIN URINE: NEGATIVE
Glucose, UA: NEGATIVE mg/dL
KETONES UR: NEGATIVE mg/dL
LEUKOCYTES UA: NEGATIVE
NITRITE: NEGATIVE
PROTEIN: NEGATIVE mg/dL
Specific Gravity, Urine: 1.024 (ref 1.005–1.030)
pH: 7 (ref 5.0–8.0)

## 2018-07-21 LAB — COMPREHENSIVE METABOLIC PANEL
ALT: 15 U/L (ref 0–44)
AST: 18 U/L (ref 15–41)
Albumin: 3.7 g/dL (ref 3.5–5.0)
Alkaline Phosphatase: 65 U/L (ref 38–126)
Anion gap: 10 (ref 5–15)
BUN: 13 mg/dL (ref 6–20)
CHLORIDE: 101 mmol/L (ref 98–111)
CO2: 28 mmol/L (ref 22–32)
CREATININE: 0.86 mg/dL (ref 0.44–1.00)
Calcium: 8.5 mg/dL — ABNORMAL LOW (ref 8.9–10.3)
GFR calc non Af Amer: >60 mL/min (ref >60)
Glucose, Bld: 107 mg/dL — ABNORMAL HIGH (ref 70–99)
POTASSIUM: 3.7 mmol/L (ref 3.5–5.1)
SODIUM: 139 mmol/L (ref 135–145)
Total Bilirubin: 0.5 mg/dL (ref 0.3–1.2)
Total Protein: 7 g/dL (ref 6.5–8.1)

## 2018-07-21 LAB — CBC
HEMATOCRIT: 40.5 % (ref 36.0–46.0)
HEMOGLOBIN: 12.9 g/dL (ref 12.0–15.0)
MCH: 27.6 pg (ref 26.0–34.0)
MCHC: 31.9 g/dL (ref 30.0–36.0)
MCV: 86.7 fL (ref 80.0–100.0)
NRBC: 0 % (ref 0.0–0.2)
Platelets: 284 10*3/uL (ref 150–400)
RBC: 4.67 MIL/uL (ref 3.87–5.11)
RDW: 12.9 % (ref 11.5–15.5)
WBC: 6.2 10*3/uL (ref 4.0–10.5)

## 2018-07-21 LAB — LIPASE, BLOOD: LIPASE: 23 U/L (ref 11–51)

## 2018-07-21 MED ORDER — DICYCLOMINE HCL 20 MG PO TABS: 20.0000 mg | ORAL_TABLET | Freq: Three times a day (TID) | ORAL | 0 refills | Status: DC | PRN

## 2018-07-21 NOTE — ED Provider Notes (Signed)
[facility] Emergency Department Provider Note   ____________________________________________    I have reviewed the triage vital signs and the nursing notes.   HISTORY  Chief Complaint Abdominal Pain     HPI Danielle Pitts is a 42 y.o. female presents with complaints of lower abdominal discomfort.  Patient reports she has had intermittent cramping in her lower abdomen since starting a new supplement 1 week ago.  She denies nausea or vomiting.  Reports normal stools.  No fevers or chills.  Has not taken anything for this.  Has continued to take a supplement.  No radiation.  No dysuria or frequency.  No vaginal discharge.  No new sexual partners  Past Medical History:  Diagnosis Date  . Anxiety   . Asthma   . GERD (gastroesophageal reflux disease)   . Migraines    complex and hemiplegic, evaluated in 2017 by neurology and with MRIs, all reassuring  . Vertigo     There are no active problems to display for this patient.   Past Surgical History:  Procedure Laterality Date  . ABDOMINAL HYSTERECTOMY    . laproscopy      Prior to Admission medications   Medication Sig Start Date End Date Taking? Authorizing Provider  acetaminophen (TYLENOL) 500 MG tablet Take 1,000 mg by mouth every 6 (six) hours as needed for headache.    [provider]  albuterol (PROVENTIL HFA;VENTOLIN HFA) 108 (90 BASE) MCG/ACT inhaler Inhale 2 puffs into the lungs every 6 (six) hours as needed for wheezing or shortness of breath. 09/21/15   Henrietta Hoover, NP  chlorpheniramine-HYDROcodone (TUSSIONEX PENNKINETIC ER) 10-8 MG/5ML SUER Take 5 mLs by mouth 2 (two) times daily. 10/27/16   Rebecka Apley, MD  dicyclomine (BENTYL) 20 MG tablet Take 1 tablet (20 mg total) by mouth 3 (three) times daily as needed for spasms. 07/21/18 07/21/19  Jene Every, MD  diphenhydrAMINE (BENADRYL) 25 MG tablet Take 1 tablet (25 mg total) by mouth every 6 (six) hours. 01/25/16    Arby Barrette, MD  diphenhydramine-acetaminophen (TYLENOL PM) 25-500 MG TABS tablet Take 2 tablets by mouth at bedtime as needed. For sleep    [provider]  HYDROcodone-homatropine (HYCODAN) 5-1.5 MG/5ML syrup Take 5 mLs by mouth every 6 (six) hours as needed for cough. 12/16/16   Loleta Rose, MD  metoCLOPramide (REGLAN) 10 MG tablet Take 1 tablet (10 mg total) by mouth every 6 (six) hours. 01/25/16   Arby Barrette, MD  ondansetron (ZOFRAN ODT) 4 MG disintegrating tablet Take 1 tablet (4 mg total) by mouth every 8 (eight) hours as needed for nausea or vomiting. 10/27/16   Rebecka Apley, MD  predniSONE (DELTASONE) 20 MG tablet Take 3 tablets (60 mg total) by mouth daily. 10/27/16   Rebecka Apley, MD  prochlorperazine (COMPAZINE) 10 MG tablet Take 1 tablet (10 mg total) by mouth every 8 (eight) hours as needed (headache). 04/29/16   Phineas Semen, MD     Allergies Aspirin and Percocet [oxycodone-acetaminophen]  Family History  Problem Relation Age of Onset  . Hypertension Other   . Diabetes Other   . Cancer Other     Social History Social History   Tobacco Use  . Smoking status: Passive Smoke Exposure - Never Smoker  . Smokeless tobacco: Never Used  Substance Use Topics  . Alcohol use: Yes    Comment: occ  . Drug use: No    Review of Systems  Constitutional: No fever/chills Eyes:  No visual changes.  ENT: No sore throat. Cardiovascular: Denies chest pain. Respiratory: Denies shortness of breath. Gastrointestinal: As above Genitourinary: As above Musculoskeletal: Negative for back pain. Skin: Negative for rash. Neurological: Negative for headaches or weakness   ____________________________________________   PHYSICAL EXAM:  VITAL SIGNS: ED Triage Vitals [07/21/18 1045]  Enc Vitals Group     BP 117/67     Pulse Rate 75     Resp 20     Temp 98.4 F (36.9 C)     Temp Source Oral     SpO2 98 %     Weight 127 kg (280 lb)     Height 1.727 m  (5\' 8" )     Head Circumference      Peak Flow      Pain Score 8     Pain Loc      Pain Edu?      Excl. in GC?     Constitutional: Alert and oriented.  Eyes: Conjunctivae are normal.   Nose: No congestion/rhinnorhea. Mouth/Throat: Mucous membranes are moist.    Cardiovascular: Normal rate, regular rhythm.  Good peripheral circulation. Respiratory: Normal respiratory effort.  No retractions. Gastrointestinal: Soft and nontender. No distention.    Musculoskeletal:   Warm and well perfused Neurologic:  Normal speech and language. No gross focal neurologic deficits are appreciated.  Skin:  Skin is warm, dry and intact. No rash noted. Psychiatric: Mood and affect are normal. Speech and behavior are normal.  ____________________________________________   LABS (all labs ordered are listed, but only abnormal results are displayed)  Labs Reviewed  COMPREHENSIVE METABOLIC PANEL - Abnormal; Notable for the following components:      Result Value   Glucose, Bld 107 (*)    Calcium 8.5 (*)    All other components within normal limits  URINALYSIS, COMPLETE (UACMP) WITH MICROSCOPIC - Abnormal; Notable for the following components:   Color, Urine YELLOW (*)    APPearance HAZY (*)    Hgb urine dipstick SMALL (*)    All other components within normal limits  LIPASE, BLOOD  CBC   ____________________________________________  EKG  None ____________________________________________  RADIOLOGY   ____________________________________________   PROCEDURES  Procedure(s) performed: No  Procedures   Critical Care performed: No ____________________________________________   INITIAL IMPRESSION / ASSESSMENT AND PLAN / ED COURSE  Pertinent labs & imaging results that were available during my care of the patient were reviewed by me and considered in my medical decision making (see chart for details).  Patient quite well-appearing in no acute distress.  Abdominal exam is  reassuring, no tenderness to palpation.  Lab work is benign.  No evidence of urinary tract infection.  Patient describes the cramping as a fairly intermittent.  Given normal exam, normal vitals, normal labs discussed with patient outpatient follow-up discharge and p.o. Bentyl.  She agrees with this plan, return precautions discussed    ____________________________________________   FINAL CLINICAL IMPRESSION(S) / ED DIAGNOSES  Final diagnoses:  Lower abdominal pain        Note:  This document was prepared using Dragon voice recognition software and may include unintentional dictation errors.    Jene Every, MD 07/21/18 1257

## 2018-07-21 NOTE — ED Notes (Signed)
Pt c/o lower abdominal cramping x 1 week. Pt states started taking Cellulose from [employer] and then the pain started. Pt states BM everyday that is normal. Pt states only time she gets relief "is from pushing on it". Pt is alert and oriented at this time.

## 2018-07-21 NOTE — ED Notes (Signed)
NAD noted at time of D/C. Pt denies comments/concerns. Pt taken to lobby via wheelchair.

## 2018-07-21 NOTE — ED Triage Notes (Signed)
Pt reports severe lower abd pain for the past week constantly. Pt describes the pain as constant cramping.  Pt reports nausea. Pt reports she did start taking a new supplement from [employer] and wonders if that is what is causing it. Pt denies urinary sx's or vaginal d/c.

## 2018-07-21 NOTE — ED Notes (Signed)
EDP at bedside  

## 2018-12-17 ENCOUNTER — Encounter: Payer: Self-pay | Admitting: Emergency Medicine

## 2018-12-17 ENCOUNTER — Emergency Department
Admission: EM | Admit: 2018-12-17 | Discharge: 2018-12-17 | Disposition: A | Discharge status: Home / Self Care | Payer: Self-pay | Attending: Emergency Medicine | Admitting: Emergency Medicine

## 2018-12-17 ENCOUNTER — Emergency Department: Payer: Self-pay

## 2018-12-17 ENCOUNTER — Other Ambulatory Visit: Payer: Self-pay

## 2018-12-17 DIAGNOSIS — J45909 Unspecified asthma, uncomplicated: Secondary | ICD-10-CM | POA: Insufficient documentation

## 2018-12-17 DIAGNOSIS — K625 Hemorrhage of anus and rectum: Secondary | ICD-10-CM | POA: Insufficient documentation

## 2018-12-17 DIAGNOSIS — Z7722 Contact with and (suspected) exposure to environmental tobacco smoke (acute) (chronic): Secondary | ICD-10-CM | POA: Insufficient documentation

## 2018-12-17 DIAGNOSIS — R1084 Generalized abdominal pain: Secondary | ICD-10-CM | POA: Insufficient documentation

## 2018-12-17 LAB — COMPREHENSIVE METABOLIC PANEL
ALBUMIN: 4.3 g/dL (ref 3.5–5.0)
ALT: 13 U/L (ref 0–44)
AST: 15 U/L (ref 15–41)
Alkaline Phosphatase: 69 U/L (ref 38–126)
Anion gap: 6 (ref 5–15)
BILIRUBIN TOTAL: 0.2 mg/dL — AB (ref 0.3–1.2)
BUN: 11 mg/dL (ref 6–20)
CHLORIDE: 105 mmol/L (ref 98–111)
CO2: 23 mmol/L (ref 22–32)
CREATININE: 0.76 mg/dL (ref 0.44–1.00)
Calcium: 8.7 mg/dL — ABNORMAL LOW (ref 8.9–10.3)
GFR calc Af Amer: >60 mL/min (ref >60)
GLUCOSE: 119 mg/dL — AB (ref 70–99)
POTASSIUM: 3.6 mmol/L (ref 3.5–5.1)
Sodium: 134 mmol/L — ABNORMAL LOW (ref 135–145)
Total Protein: 7.9 g/dL (ref 6.5–8.1)

## 2018-12-17 LAB — TYPE AND SCREEN
ABO/RH(D): O POS
Antibody Screen: NEGATIVE

## 2018-12-17 LAB — URINALYSIS, COMPLETE (UACMP) WITH MICROSCOPIC
Bacteria, UA: NONE SEEN
Bilirubin Urine: NEGATIVE
Glucose, UA: NEGATIVE mg/dL
Ketones, ur: NEGATIVE mg/dL
Leukocytes,Ua: NEGATIVE
Nitrite: NEGATIVE
Protein, ur: NEGATIVE mg/dL
pH: 6 (ref 5.0–8.0)

## 2018-12-17 LAB — LIPASE, BLOOD: LIPASE: 28 U/L (ref 11–51)

## 2018-12-17 LAB — CBC
HCT: 43.3 % (ref 36.0–46.0)
Hemoglobin: 13.4 g/dL (ref 12.0–15.0)
MCH: 27.2 pg (ref 26.0–34.0)
MCHC: 30.9 g/dL (ref 30.0–36.0)
MCV: 88 fL (ref 80.0–100.0)
Platelets: 326 10*3/uL (ref 150–400)
RBC: 4.92 MIL/uL (ref 3.87–5.11)
RDW: 13.1 % (ref 11.5–15.5)
WBC: 5.8 10*3/uL (ref 4.0–10.5)
nRBC: 0 % (ref 0.0–0.2)

## 2018-12-17 MED ORDER — SODIUM CHLORIDE 0.9% FLUSH: 3.0000 mL | Freq: Once | INTRAVENOUS | Status: DC

## 2018-12-17 MED ORDER — IOHEXOL 300 MG/ML  SOLN
100.0000 mL | Freq: Once | INTRAMUSCULAR | Status: AC | PRN
  Administered 2018-12-17: 100 mL via INTRAVENOUS

## 2018-12-17 MED ORDER — HYDROCODONE-ACETAMINOPHEN 5-325 MG PO TABS: 1.0000 | ORAL_TABLET | Freq: Four times a day (QID) | ORAL | 0 refills | Status: AC | PRN

## 2018-12-17 MED ORDER — DOCUSATE SODIUM 100 MG PO CAPS: 100.0000 mg | ORAL_CAPSULE | Freq: Two times a day (BID) | ORAL | 2 refills | Status: AC

## 2018-12-17 NOTE — ED Provider Notes (Signed)
[facility] Emergency Department Provider Note       Time seen: ----------------------------------------- 11:27 AM on 12/17/2018 -----------------------------------------   I have reviewed the triage vital signs and the nursing notes.  HISTORY   Chief Complaint Rectal Bleeding and Abdominal Pain   HPI Danielle Pitts is a 43 y.o. female with a history of anxiety, asthma, GERD, migraines, vertigo who presents to the ED for rectal bleeding.  Patient complains of bright red blood per rectum for past several weeks.  She has had increased weakness with abdominal cramping.  She states she just feels bad in general and again is complaining of abdominal cramping.  Past Medical History:  Diagnosis Date  . Anxiety   . Asthma   . GERD (gastroesophageal reflux disease)   . Migraines    complex and hemiplegic, evaluated in 2017 by neurology and with MRIs, all reassuring  . Vertigo     There are no active problems to display for this patient.   Past Surgical History:  Procedure Laterality Date  . ABDOMINAL HYSTERECTOMY    . laproscopy      Allergies Aspirin and Percocet [oxycodone-acetaminophen]  Social History Social History   Tobacco Use  . Smoking status: Passive Smoke Exposure - Never Smoker  . Smokeless tobacco: Never Used  Substance Use Topics  . Alcohol use: Yes    Comment: occ  . Drug use: No    Review of Systems Constitutional: Negative for fever. Cardiovascular: Negative for chest pain. Respiratory: Negative for shortness of breath. Gastrointestinal: Positive for abdominal pain, rectal bleeding Musculoskeletal: Negative for back pain. Skin: Negative for rash. Neurological: Negative for headaches, focal weakness or numbness.  All systems negative/normal/unremarkable except as stated in the HPI  ____________________________________________   PHYSICAL EXAM:  VITAL SIGNS: ED Triage Vitals  Enc Vitals Group     BP 12/17/18 1123  138/64     Pulse Rate 12/17/18 1122 76     Resp 12/17/18 1122 16     Temp 12/17/18 1122 98.2 F (36.8 C)     Temp Source 12/17/18 1122 Oral     SpO2 12/17/18 1122 99 %     Weight --      Height --      Head Circumference --      Peak Flow --      Pain Score 12/17/18 1121 7     Pain Loc --      Pain Edu? --      Excl. in GC? --    Constitutional: Alert and oriented. Well appearing and in no distress. Eyes: Conjunctivae are normal. Normal extraocular movements. Cardiovascular: Normal rate, regular rhythm. No murmurs, rubs, or gallops. Respiratory: Normal respiratory effort without tachypnea nor retractions. Breath sounds are clear and equal bilaterally. No wheezes/rales/rhonchi. Gastrointestinal: Soft and nontender. Normal bowel sounds Musculoskeletal: Nontender with normal range of motion in extremities. No lower extremity tenderness nor edema. Neurologic:  Normal speech and language. No gross focal neurologic deficits are appreciated.  Skin:  Skin is warm, dry and intact. No rash noted. Psychiatric: Mood and affect are normal. Speech and behavior are normal.  ____________________________________________  ED COURSE:  As part of my medical decision making, I reviewed the following data within the electronic MEDICAL RECORD NUMBER History obtained from family if available, nursing notes, old chart and ekg, as well as notes from prior ED visits. Patient presented for abdominal pain and rectal bleeding, we will assess with labs and imaging as indicated at this time.  Procedures ____________________________________________   LABS (pertinent positives/negatives)  Labs Reviewed  COMPREHENSIVE METABOLIC PANEL - Abnormal; Notable for the following components:      Result Value   Sodium 134 (*)    Glucose, Bld 119 (*)    Calcium 8.7 (*)    Total Bilirubin 0.2 (*)    All other components within normal limits  URINALYSIS, COMPLETE (UACMP) WITH MICROSCOPIC - Abnormal; Notable for the  following components:   Color, Urine STRAW (*)    APPearance CLEAR (*)    Specific Gravity, Urine >1.046 (*)    Hgb urine dipstick SMALL (*)    All other components within normal limits  LIPASE, BLOOD  CBC  TYPE AND SCREEN   ____________________________________________   DIFFERENTIAL DIAGNOSIS   Anemia, hemorrhoidal bleeding, diverticulosis, anal fissure, GERD, peptic ulcer disease  FINAL ASSESSMENT AND PLAN  Rectal bleeding   Plan: The patient had presented for rectal bleeding. Patient's labs did not reveal any acute process. Patient's imaging is reassuring.  No clear etiology for her rectal bleeding is noted.  I do not appreciate any hemorrhoids on rectal examination.  I have advised close outpatient follow-up with GI for colonoscopy.   Ulice Dash, MD    Note: This note was generated in part or whole with voice recognition software. Voice recognition is usually quite accurate but there are transcription errors that can and very often do occur. I apologize for any typographical errors that were not detected and corrected.     Emily Filbert, MD 12/17/18 [phone]

## 2018-12-17 NOTE — ED Notes (Signed)
Pt assisted with urine collection

## 2018-12-17 NOTE — ED Notes (Signed)
Pt states she has rectal bleeding after her bowel movements x2 weeks and it has progressively gotten worse. Has SHOB, dizziness, weakness.

## 2018-12-17 NOTE — ED Triage Notes (Signed)
Says blood per rectum and stomach cramping and just feels bad.

## 2018-12-17 NOTE — ED Triage Notes (Signed)
PT c/o bright red blood with Bowel movements xfew weeks. PT states increased weakness with abd cramping. Hx of vertigo . VSS

## 2018-12-21 ENCOUNTER — Telehealth: Payer: Self-pay | Admitting: Gastroenterology

## 2018-12-21 NOTE — Telephone Encounter (Signed)
Left vm for pt to call office to schedule ed f/u

## 2018-12-25 ENCOUNTER — Encounter: Payer: Self-pay | Admitting: Gastroenterology

## 2018-12-25 ENCOUNTER — Telehealth: Payer: Self-pay | Admitting: Gastroenterology

## 2018-12-25 NOTE — Telephone Encounter (Signed)
Left vm for pt to call office and schedule Ed f/u. Letter send

## 2019-09-30 ENCOUNTER — Encounter: Payer: Self-pay | Admitting: Physician Assistant

## 2019-09-30 ENCOUNTER — Ambulatory Visit: Payer: Self-pay | Admitting: Physician Assistant

## 2019-09-30 ENCOUNTER — Other Ambulatory Visit: Payer: Self-pay

## 2019-09-30 DIAGNOSIS — Z113 Encounter for screening for infections with a predominantly sexual mode of transmission: Secondary | ICD-10-CM

## 2019-09-30 DIAGNOSIS — Z202 Contact with and (suspected) exposure to infections with a predominantly sexual mode of transmission: Secondary | ICD-10-CM

## 2019-09-30 MED ORDER — METRONIDAZOLE 500 MG PO TABS: 2000.0000 mg | ORAL_TABLET | Freq: Once | ORAL | 0 refills | Status: AC

## 2019-09-30 NOTE — Progress Notes (Signed)
  [facility] Department STI clinic/screening visit  Subjective:  Danielle Pitts is a 43 y.o. female being seen today for an STI screening visit and treatment for contact to [place]. The patient reports they do have symptoms.  Patient reports that they do not desire a pregnancy in the next year.   They reported they are not interested in discussing contraception today, has had hysterectomy.   Patient's last menstrual period was 11/01/2011.   Patient has the following medical conditions:  There are no problems to display for this patient.   Chief Complaint  Patient presents with  . Exposure to STD    Trich    HPI Pt reports her sole female sexual partner told her this morning that he had been with another woman who has Trich, he was tested yest and was also pos for Trich and has been treated.  Patient reports several weeks of abdominal pain and loose stools, improving.  See flowsheet for further details and programmatic requirements.    The following portions of the patient's history were reviewed and updated as appropriate: allergies, current medications, past medical history, past social history, past surgical history and problem list.  Objective:  There were no vitals filed for this visit.  Physical Exam Constitutional:      Appearance: She is obese.  Pulmonary:     Effort: Pulmonary effort is normal.  Neurological:     Mental Status: She is alert.  Psychiatric:        Mood and Affect: Mood normal.        Behavior: Behavior normal.        Thought Content: Thought content normal.        Judgment: Judgment normal.    pt declines physical exam and STI testing.   Assessment and Plan:  [name] is a 43 y.o. female presenting to the [facility] Department for STI screening  1. Trichimoniasis - contact Treat per s.o. - metroNIDAZOLE (FLAGYL) 500 MG tablet; Take 4 tablets (2,000 mg total) by mouth once for 1 dose.  Dispense: 4 tablet; Refill:  0     Return in about 6 months (around 03/30/2020) for Routine STI screening.  No future appointments.  Lora Havens, PA-C

## 2019-09-30 NOTE — Progress Notes (Signed)
Patient treated per standing orders as a contact to [place]. Hal Morales, RN

## 2019-09-30 NOTE — Progress Notes (Signed)
Here today as a contact to [place]. Declines STD screening. Hal Morales, RN

## 2020-03-08 ENCOUNTER — Emergency Department: Payer: Self-pay

## 2020-03-08 ENCOUNTER — Other Ambulatory Visit: Payer: Self-pay

## 2020-03-08 ENCOUNTER — Emergency Department
Admission: EM | Admit: 2020-03-08 | Discharge: 2020-03-08 | Disposition: A | Discharge status: Home / Self Care | Payer: Self-pay | Attending: Student | Admitting: Student

## 2020-03-08 DIAGNOSIS — Z7722 Contact with and (suspected) exposure to environmental tobacco smoke (acute) (chronic): Secondary | ICD-10-CM | POA: Insufficient documentation

## 2020-03-08 DIAGNOSIS — J45909 Unspecified asthma, uncomplicated: Secondary | ICD-10-CM | POA: Insufficient documentation

## 2020-03-08 DIAGNOSIS — M7918 Myalgia, other site: Secondary | ICD-10-CM | POA: Insufficient documentation

## 2020-03-08 DIAGNOSIS — R0789 Other chest pain: Secondary | ICD-10-CM | POA: Insufficient documentation

## 2020-03-08 DIAGNOSIS — M791 Myalgia, unspecified site: Secondary | ICD-10-CM

## 2020-03-08 DIAGNOSIS — F419 Anxiety disorder, unspecified: Secondary | ICD-10-CM | POA: Insufficient documentation

## 2020-03-08 LAB — BASIC METABOLIC PANEL
Anion gap: 11 (ref 5–15)
BUN: 10 mg/dL (ref 6–20)
CO2: 24 mmol/L (ref 22–32)
Calcium: 8.7 mg/dL — ABNORMAL LOW (ref 8.9–10.3)
Chloride: 99 mmol/L (ref 98–111)
Creatinine, Ser: 0.79 mg/dL (ref 0.44–1.00)
GFR calc Af Amer: >60 mL/min (ref >60)
GFR calc non Af Amer: >60 mL/min (ref >60)
Glucose, Bld: 177 mg/dL — ABNORMAL HIGH (ref 70–99)
Potassium: 4 mmol/L (ref 3.5–5.1)
Sodium: 134 mmol/L — ABNORMAL LOW (ref 135–145)

## 2020-03-08 LAB — CBC
HCT: 42.5 % (ref 36.0–46.0)
Hemoglobin: 13.1 g/dL (ref 12.0–15.0)
MCH: 26.6 pg (ref 26.0–34.0)
MCHC: 30.8 g/dL (ref 30.0–36.0)
MCV: 86.2 fL (ref 80.0–100.0)
Platelets: 295 10*3/uL (ref 150–400)
RBC: 4.93 MIL/uL (ref 3.87–5.11)
RDW: 12.8 % (ref 11.5–15.5)
WBC: 6.2 10*3/uL (ref 4.0–10.5)
nRBC: 0 % (ref 0.0–0.2)

## 2020-03-08 LAB — TROPONIN I (HIGH SENSITIVITY)
Troponin I (High Sensitivity): <2 ng/L (ref <18)
Troponin I (High Sensitivity): <2 ng/L (ref <18)

## 2020-03-08 MED ORDER — IBUPROFEN 600 MG PO TABS: 600.0000 mg | ORAL_TABLET | Freq: Four times a day (QID) | ORAL | 0 refills | Status: AC | PRN

## 2020-03-08 MED ORDER — SODIUM CHLORIDE 0.9% FLUSH: 3.0000 mL | Freq: Once | INTRAVENOUS | Status: DC

## 2020-03-08 NOTE — ED Triage Notes (Signed)
Pt comes via pOV from home with c/o chest pain that started Sunday. Pt states pain to mid sternal chest. Pt states it radiates down her leg arm and hand.  Pt states some SOB.

## 2020-03-08 NOTE — Discharge Instructions (Signed)
Thank you for letting us take care of you in the emergency department today.   Please continue to take any regular, prescribed medications.   New medications we have prescribed:  Ibuprofen, as needed for muscle soreness, chest pains  Please follow up with: Your primary care doctor to review your ER visit and follow up on your symptoms.  The resources provided to you by TTS to help manage your anxiety!  Please return to the ER for any new or worsening symptoms.

## 2020-03-08 NOTE — ED Notes (Signed)
phlebotomy here for lab recehck

## 2020-03-08 NOTE — ED Provider Notes (Signed)
[facility] Emergency Department Provider Note  ____________________________________________   First MD Initiated Contact with Patient 03/08/20 1740     (approximate)  I have reviewed the triage vital signs and the nursing notes.  History  Chief Complaint Chest Pain    HPI Danielle Pitts is a 44 y.o. female with history of GERD, anxiety, asthma who presents to the emergency department for chest discomfort.  Patient states she has a known history of anxiety, and knows that during her anxiety attacks she has symptoms of chest discomfort.  Over the weekend she has had several anxiety attacks.  Normally she is able to calm herself or help the symptoms with a cold shower. She has also been started on fluoxetine by her PCP. However, after one of her anxiety episodes over the weekend she feels like her chest discomfort has continued/persisted, which is atypical.  She describes left-sided soreness, which radiates to her left arm.  Moderate in severity.  She reports some pain along the left costochondral junction as well.  No alleviating/aggravating components.  No associated shortness of breath, nausea, diaphoresis, no pleuritic chest pain.  No leg swelling, recent travel, history of VTE, or hormonal medication use.   Past Medical Hx Past Medical History:  Diagnosis Date  . Anxiety   . Asthma   . GERD (gastroesophageal reflux disease)   . Migraines    complex and hemiplegic, evaluated in 2017 by neurology and with MRIs, all reassuring  . Vertigo     Problem List There are no problems to display for this patient.   Past Surgical Hx Past Surgical History:  Procedure Laterality Date  . ABDOMINAL HYSTERECTOMY    . laproscopy      Medications Prior to Admission medications   Medication Sig Start Date End Date Taking? Authorizing Provider  acetaminophen (TYLENOL) 500 MG tablet Take 1,000 mg by mouth every 6 (six) hours as needed for headache.    [provider]  diphenhydramine-acetaminophen (TYLENOL PM) 25-500 MG TABS tablet Take 2 tablets by mouth at bedtime as needed. For sleep    [provider]  HYDROcodone-acetaminophen (NORCO/VICODIN) 5-325 MG tablet Take 1 tablet by mouth every 6 (six) hours as needed for moderate pain. Patient not taking: Reported on 09/30/2019 12/17/18   Earleen Newport, MD    Allergies Aspirin, Percocet [oxycodone-acetaminophen], and Tramadol  Family Hx Family History  Problem Relation Age of Onset  . Hypertension Other   . Diabetes Other   . Cancer Other     Social Hx Social History   Tobacco Use  . Smoking status: Passive Smoke Exposure - Never Smoker  . Smokeless tobacco: Never Used  Substance Use Topics  . Alcohol use: Yes    Comment: occ  . Drug use: No     Review of Systems  Constitutional: Negative for fever. Negative for chills. Eyes: Negative for visual changes. ENT: Negative for sore throat. Cardiovascular: + for chest pain. Respiratory: Negative for shortness of breath. Gastrointestinal: Negative for nausea. Negative for vomiting.  Genitourinary: Negative for dysuria. Musculoskeletal: Negative for leg swelling. Skin: Negative for rash. Neurological: Negative for headaches.   Physical Exam  Vital Signs: ED Triage Vitals  Enc Vitals Group     BP 03/08/20 1212 129/63     Pulse Rate 03/08/20 1212 79     Resp 03/08/20 1212 18     Temp 03/08/20 1212 98.3 F (36.8 C)     Temp Source 03/08/20 1616 Oral  SpO2 03/08/20 1212 97 %     Weight 03/08/20 1213 299 lb (135.6 kg)     Height 03/08/20 1213 5\' 7"  (1.702 m)     Head Circumference --      Peak Flow --      Pain Score 03/08/20 1213 8     Pain Loc --      Pain Edu? --      Excl. in GC? --     Constitutional: Alert and oriented. Well appearing. NAD.  Head: Normocephalic. Atraumatic. Eyes: Conjunctivae clear. Sclera anicteric. Pupils equal and symmetric. Nose: No masses or lesions. No congestion or  rhinorrhea. Mouth/Throat: Wearing mask.  Neck: No stridor. Trachea midline.  Cardiovascular: Normal rate, regular rhythm. Extremities well perfused.  Chest discomfort is reproducible with palpation along the left costochondral junction.  Chest wall stable, no palpable deformities, step-offs, crepitance. Respiratory: Normal respiratory effort.  Lungs CTAB. Gastrointestinal: Soft. Non-distended. Non-tender.  Genitourinary: Deferred. Musculoskeletal: No lower extremity edema. No deformities. Neurologic:  Normal speech and language. No gross focal or lateralizing neurologic deficits are appreciated.  Skin: Skin is warm, dry and intact. No rash noted. Psychiatric: Mood and affect are appropriate for situation.  EKG  Personally reviewed and interpreted by myself.   Date: 03/08/20 Time: 1150 Rate: 79 Rhythm: sinus Axis: normal Intervals: WNl No acute ischemic changes No acute arrhythmia No STEMI    Radiology  Personally reviewed available imaging myself.   CXR - IMPRESSION:  No acute abnormality.    Procedures  Procedure(s) performed (including critical care):  Procedures   Initial Impression / Assessment and Plan / MDM / ED Course  44 y.o. female with history of anxiety who presents to the ED for left-sided chest soreness since this weekend, in the setting of increased anxiety.  Exam as above, of note, chest pain is reproducible with palpation along the left costochondral junction.  Presentation likely multifactorial, including an MSK/costochondritis component as well as component of anxiety.  EKG is without acute ischemic changes or evidence of acute arrhythmia.  Troponin and delta troponin both negative.  CXR is clear.  PERC negative.  As such, feel patient is stable for discharge with outpatient follow-up and supportive care.  Will provide Rx for ibuprofen for likely MSK component.  TTS consulted and provided resources for follow-up with regards to her anxiety.  Patient is  comfortable to plan and discharge.  Given return precautions.  _______________________________   As part of my medical decision making I have reviewed available labs, radiology tests, reviewed old records/performed chart review, obtained additional history from family, and discussed with consultants (TTS).     Final Clinical Impression(s) / ED Diagnosis  Final diagnoses:  Atypical chest pain  Musculoskeletal chest pain  Muscle soreness  Anxiety       Note:  This document was prepared using Dragon voice recognition software and may include unintentional dictation errors.   45., MD 03/08/20 03/10/20

## 2020-08-23 ENCOUNTER — Emergency Department
Admission: EM | Admit: 2020-08-23 | Discharge: 2020-08-23 | Disposition: A | Discharge status: Home / Self Care | Payer: Self-pay | Attending: Emergency Medicine | Admitting: Emergency Medicine

## 2020-08-23 ENCOUNTER — Encounter: Payer: Self-pay | Admitting: Emergency Medicine

## 2020-08-23 ENCOUNTER — Emergency Department: Payer: Self-pay

## 2020-08-23 ENCOUNTER — Other Ambulatory Visit: Payer: Self-pay

## 2020-08-23 DIAGNOSIS — W010XXA Fall on same level from slipping, tripping and stumbling without subsequent striking against object, initial encounter: Secondary | ICD-10-CM | POA: Insufficient documentation

## 2020-08-23 DIAGNOSIS — Y990 Civilian activity done for income or pay: Secondary | ICD-10-CM | POA: Insufficient documentation

## 2020-08-23 DIAGNOSIS — J45909 Unspecified asthma, uncomplicated: Secondary | ICD-10-CM | POA: Insufficient documentation

## 2020-08-23 DIAGNOSIS — S62001A Unspecified fracture of navicular [scaphoid] bone of right wrist, initial encounter for closed fracture: Secondary | ICD-10-CM

## 2020-08-23 DIAGNOSIS — S62024A Nondisplaced fracture of middle third of navicular [scaphoid] bone of right wrist, initial encounter for closed fracture: Secondary | ICD-10-CM | POA: Insufficient documentation

## 2020-08-23 DIAGNOSIS — M25561 Pain in right knee: Secondary | ICD-10-CM | POA: Insufficient documentation

## 2020-08-23 DIAGNOSIS — Z7722 Contact with and (suspected) exposure to environmental tobacco smoke (acute) (chronic): Secondary | ICD-10-CM | POA: Insufficient documentation

## 2020-08-23 MED ORDER — LIDOCAINE 5 % EX PTCH: 1.0000 | MEDICATED_PATCH | Freq: Two times a day (BID) | CUTANEOUS | 0 refills | Status: AC

## 2020-08-23 NOTE — ED Notes (Signed)
Per Maudry Diego, [facility], this will be a WC claim and they do require a UDS.

## 2020-08-23 NOTE — ED Notes (Signed)
Talked to Almira Coaster - She said that pt was actually employed by [employer]. 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.

## 2020-08-23 NOTE — ED Notes (Signed)
Called Maudry Diego [phone]. She said it was unclear to her whether pt would be covered by [facility] or [facility]. She was going to call Ms. Blackwell and call me back.

## 2020-08-23 NOTE — Discharge Instructions (Signed)
Wrist splint and follow discharge care instructions pending evaluation by orthopedics.  Call the clinic today and tell them you are a follow-up from the emergency room.

## 2020-08-23 NOTE — ED Notes (Signed)
Pt refused UDS and said to send her the bill.

## 2020-08-23 NOTE — ED Notes (Signed)
Attempted to call [name] [phone] to notify her pt was here and to ask about WC. Left message. 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

## 2020-08-23 NOTE — ED Provider Notes (Signed)
[facility] Emergency Department Provider Note   ____________________________________________   None    (approximate)  I have reviewed the triage vital signs and the nursing notes.   HISTORY  Chief Complaint Fall    HPI Danielle Pitts is a 44 y.o. female patient complain of right wrist and right knee pain secondary to a slip and fall.  Patient states she was at work and slipped on a wet floor.  Patient states she broke the fall with her hands but hit her knee.  Patient stated previous entry knee 3 years ago.  Patient rates her pain 7/10.  Patient describes pain as "achy".  Patient is wearing a knee brace.  Patient is right-hand dominant.  No other palliative measure for complaint.         Past Medical History:  Diagnosis Date  . Anxiety   . Asthma   . GERD (gastroesophageal reflux disease)   . Migraines    complex and hemiplegic, evaluated in 2017 by neurology and with MRIs, all reassuring  . Vertigo     There are no problems to display for this patient.   Past Surgical History:  Procedure Laterality Date  . ABDOMINAL HYSTERECTOMY    . laproscopy      Prior to Admission medications   Medication Sig Start Date End Date Taking? Authorizing Provider  acetaminophen (TYLENOL) 500 MG tablet Take 1,000 mg by mouth every 6 (six) hours as needed for headache.    [provider]  diphenhydramine-acetaminophen (TYLENOL PM) 25-500 MG TABS tablet Take 2 tablets by mouth at bedtime as needed. For sleep    [provider]  HYDROcodone-acetaminophen (NORCO/VICODIN) 5-325 MG tablet Take 1 tablet by mouth every 6 (six) hours as needed for moderate pain. Patient not taking: Reported on 09/30/2019 12/17/18   Emily Filbert, MD  lidocaine (LIDODERM) 5 % Place 1 patch onto the skin every 12 (twelve) hours. Remove & Discard patch within 12 hours or as directed by MD 08/23/20 08/23/21  Joni Reining, PA-C    Allergies Aspirin,  Percocet [oxycodone-acetaminophen], and Tramadol  Family History  Problem Relation Age of Onset  . Hypertension Other   . Diabetes Other   . Cancer Other     Social History Social History   Tobacco Use  . Smoking status: Passive Smoke Exposure - Never Smoker  . Smokeless tobacco: Never Used  Substance Use Topics  . Alcohol use: Yes    Comment: occ  . Drug use: No    Review of Systems Constitutional: No fever/chills Eyes: No visual changes. ENT: No sore throat. Cardiovascular: Denies chest pain. Respiratory: Denies shortness of breath. Gastrointestinal: No abdominal pain.  No nausea, no vomiting.  No diarrhea.  No constipation. Genitourinary: Negative for dysuria. Musculoskeletal: Right wrist and right knee pain.   Skin: Negative for rash. Neurological: Negative for headaches, focal weakness or numbness. Psychiatric:  Anxiety Allergic/Immunilogical: Aspirin, Percocet, and tramadol. ____________________________________________   PHYSICAL EXAM:  VITAL SIGNS: ED Triage Vitals  Enc Vitals Group     BP 08/23/20 0851 120/72     Pulse Rate 08/23/20 0851 64     Resp 08/23/20 0851 16     Temp 08/23/20 0851 98.7 F (37.1 C)     Temp Source 08/23/20 0851 Oral     SpO2 08/23/20 0851 99 %     Weight 08/23/20 0850 299 lb (135.6 kg)     Height 08/23/20 0850 5\' 7"  (1.702 m)  Head Circumference --      Peak Flow --      Pain Score 08/23/20 0849 7     Pain Loc --      Pain Edu? --      Excl. in GC? --    Constitutional: Alert and oriented. Well appearing and in no acute distress. Neck: No cervical spine tenderness to palpation. Hematological/Lymphatic/Immunilogical: No cervical lymphadenopathy. Cardiovascular: Normal rate, regular rhythm. Grossly normal heart sounds.  Good peripheral circulation. Respiratory: Normal respiratory effort.  No retractions. Lungs CTAB. Gastrointestinal: Soft and nontender. No distention. No abdominal bruits. No CVA  tenderness. Genitourinary: Deferred Musculoskeletal: No obvious deformity to the right wrist or right knee. Neurologic:  Normal speech and language. No gross focal neurologic deficits are appreciated. No gait instability. Skin:  Skin is warm, dry and intact. No rash noted.  No abrasion or ecchymosis. Psychiatric: Mood and affect are normal. Speech and behavior are normal.  ____________________________________________   LABS (all labs ordered are listed, but only abnormal results are displayed)  Labs Reviewed - No data to display ____________________________________________  EKG   ____________________________________________  RADIOLOGY I, Joni Reining, personally viewed and evaluated these images (plain radiographs) as part of my medical decision making, as well as reviewing the written report by the radiologist.  ED MD interpretation: No acute findings.  Questionable scaphoid fracture.  Official radiology report(s): DG Wrist Complete Right  Result Date: 08/23/2020 CLINICAL DATA:  Right wrist pain after fall EXAM: RIGHT WRIST - COMPLETE 3+ VIEW COMPARISON:  None. FINDINGS: Irregular cortical lucency at the scaphoid waist suspicious for nondisplaced fracture. Remaining carpal bones appear intact. Carpal intraosseous spaces are maintained. Radiocarpal alignment is normal. No significant arthropathy. Mild soft tissue swelling. IMPRESSION: Irregular cortical lucency at the scaphoid waist suspicious for nondisplaced fracture. Electronically Signed   By: Duanne Guess D.O.   On: 08/23/2020 09:57   DG Knee Complete 4 Views Right  Result Date: 08/23/2020 CLINICAL DATA:  Right knee pain after fall EXAM: RIGHT KNEE - COMPLETE 4+ VIEW COMPARISON:  10/11/2017 FINDINGS: No evidence of acute fracture, dislocation, or joint effusion. Unchanged small osseous fragment adjacent to the medial tibial spine, likely sequela of remote trauma. Mild medial compartment joint space narrowing with small  marginal osteophytosis, progressed from prior. Soft tissues within normal limits. IMPRESSION: 1. No acute fracture or dislocation. 2. Mild medial compartment osteoarthritis. Electronically Signed   By: Duanne Guess D.O.   On: 08/23/2020 09:55    ____________________________________________   PROCEDURES  Procedure(s) performed (including Critical Care):  .Splint Application  Date/Time: 08/23/2020 10:27 AM Performed by: Ricke Hey, NT Authorized by: Joni Reining, PA-C   Consent:    Consent obtained:  Verbal   Consent given by:  Patient   Risks discussed:  Numbness, pain and swelling Pre-procedure details:    Sensation:  Normal Procedure details:    Laterality:  Right   Location:  Wrist   Wrist:  R wrist   Supplies:  Prefabricated splint Post-procedure details:    Pain:  Unchanged   Sensation:  Normal   Patient tolerance of procedure:  Tolerated well, no immediate complications     ____________________________________________   INITIAL IMPRESSION / ASSESSMENT AND PLAN / ED COURSE  As part of my medical decision making, I reviewed the following data within the electronic MEDICAL RECORD NUMBER        Patient presented with right wrist and right knee pain secondary to a slip and fall.  Discussed x-ray findings with  patient revealing questionable scaphoid fracture of the right wrist.  The right knee showed only degenerative changes.  Patient placed in a splint and given discharge care instruction.  Patient advised to follow orthopedic for definitive evaluation and treatment.      ____________________________________________   FINAL CLINICAL IMPRESSION(S) / ED DIAGNOSES  Final diagnoses:  Closed nondisplaced fracture of scaphoid of right wrist, unspecified portion of scaphoid, initial encounter     ED Discharge Orders         Ordered    lidocaine (LIDODERM) 5 %  Every 12 hours        08/23/20 1020          *Please note:  Taylar L Newman was  evaluated in Emergency Department on 08/23/2020 for the symptoms described in the history of present illness. She was evaluated in the context of the global COVID-19 pandemic, which necessitated consideration that the patient might be at risk for infection with the SARS-CoV-2 virus that causes COVID-19. Institutional protocols and algorithms that pertain to the evaluation of patients at risk for COVID-19 are in a state of rapid change based on information released by regulatory bodies including the CDC and federal and state organizations. These policies and algorithms were followed during the patient's care in the ED.  Some ED evaluations and interventions may be delayed as a result of limited staffing during and the pandemic.*   Note:  This document was prepared using Dragon voice recognition software and may include unintentional dictation errors.    Joni Reining, PA-C 08/23/20 1028    Minna Antis, MD 08/23/20 1327

## 2020-08-23 NOTE — ED Triage Notes (Signed)
Fell, slipped on wet floor.  C/O right knee and bilateral wrist pain.

## 2020-08-23 NOTE — ED Notes (Signed)
Pt with workers comp claim. Per [facility] wc profile testing is done on request if substance impairment is thought to have caused the injury. Pt a/o no s/s of impairment. Attempted to call [facility] with no answer. Per EDP seeing pt states he has no suspicion of impairment.

## 2021-03-01 ENCOUNTER — Emergency Department: Payer: Self-pay

## 2021-03-01 ENCOUNTER — Emergency Department
Admission: EM | Admit: 2021-03-01 | Discharge: 2021-03-01 | Disposition: A | Discharge status: Home / Self Care | Payer: Self-pay | Attending: Emergency Medicine | Admitting: Emergency Medicine

## 2021-03-01 ENCOUNTER — Other Ambulatory Visit: Payer: Self-pay

## 2021-03-01 DIAGNOSIS — Z7722 Contact with and (suspected) exposure to environmental tobacco smoke (acute) (chronic): Secondary | ICD-10-CM | POA: Insufficient documentation

## 2021-03-01 DIAGNOSIS — R1110 Vomiting, unspecified: Secondary | ICD-10-CM | POA: Insufficient documentation

## 2021-03-01 DIAGNOSIS — R0789 Other chest pain: Secondary | ICD-10-CM | POA: Insufficient documentation

## 2021-03-01 DIAGNOSIS — J45909 Unspecified asthma, uncomplicated: Secondary | ICD-10-CM | POA: Insufficient documentation

## 2021-03-01 DIAGNOSIS — F419 Anxiety disorder, unspecified: Secondary | ICD-10-CM | POA: Insufficient documentation

## 2021-03-01 DIAGNOSIS — R0602 Shortness of breath: Secondary | ICD-10-CM | POA: Insufficient documentation

## 2021-03-01 LAB — CBC
HCT: 41.7 % (ref 36.0–46.0)
Hemoglobin: 13.4 g/dL (ref 12.0–15.0)
MCH: 27.6 pg (ref 26.0–34.0)
MCHC: 32.1 g/dL (ref 30.0–36.0)
MCV: 85.8 fL (ref 80.0–100.0)
Platelets: 261 10*3/uL (ref 150–400)
RBC: 4.86 MIL/uL (ref 3.87–5.11)
RDW: 13.5 % (ref 11.5–15.5)
WBC: 7.6 10*3/uL (ref 4.0–10.5)
nRBC: 0 % (ref 0.0–0.2)

## 2021-03-01 LAB — BASIC METABOLIC PANEL
Anion gap: 10 (ref 5–15)
BUN: 13 mg/dL (ref 6–20)
CO2: 23 mmol/L (ref 22–32)
Calcium: 8.7 mg/dL — ABNORMAL LOW (ref 8.9–10.3)
Chloride: 104 mmol/L (ref 98–111)
Creatinine, Ser: 0.82 mg/dL (ref 0.44–1.00)
GFR, Estimated: >60 mL/min (ref >60)
Glucose, Bld: 112 mg/dL — ABNORMAL HIGH (ref 70–99)
Potassium: 3.9 mmol/L (ref 3.5–5.1)
Sodium: 137 mmol/L (ref 135–145)

## 2021-03-01 LAB — TROPONIN I (HIGH SENSITIVITY): Troponin I (High Sensitivity): 2 ng/L (ref <18)

## 2021-03-01 MED ORDER — OMEPRAZOLE MAGNESIUM 20 MG PO TBEC: 20.0000 mg | DELAYED_RELEASE_TABLET | Freq: Every day | ORAL | 1 refills | Status: AC

## 2021-03-01 MED ORDER — HYDROXYZINE PAMOATE 25 MG PO CAPS: 25.0000 mg | ORAL_CAPSULE | Freq: Three times a day (TID) | ORAL | 0 refills | Status: AC | PRN

## 2021-03-01 NOTE — ED Provider Notes (Signed)
[facility] Emergency Department Provider Note   ____________________________________________   Event Date/Time   First MD Initiated Contact with Patient 03/01/21 [phone]     (approximate)  I have reviewed the triage vital signs and the nursing notes.   HISTORY  Chief Complaint Shortness of Breath and Chest Pain    HPI Danielle Pitts is a 45 y.o. female with past medical history of asthma who presents to the ED complaining of chest pain and shortness of breath.  Patient reports that she has been dealing with intermittent pain in her chest and difficulty breathing for the past couple of days.  She describes the pain as a tightness that seems to get worse as she gets more anxious.  As the tightness gets worse, she also feels like it is harder for her to breathe.  She denies any fevers or cough, has not noticed any pain or swelling in her legs.  She states she has had similar episodes of pain in the past with unremarkable work-ups.  She does not currently take any medication for anxiety.  She reports some worsening of pain in her chest this morning after she had an episode of vomiting.  She now feels like there is additional burning discomfort in the center of her chest, she no longer feels nauseous and denies any pain in her abdomen.        Past Medical History:  Diagnosis Date  . Anxiety   . Asthma   . GERD (gastroesophageal reflux disease)   . Migraines    complex and hemiplegic, evaluated in 2017 by neurology and with MRIs, all reassuring  . Vertigo     There are no problems to display for this patient.   Past Surgical History:  Procedure Laterality Date  . ABDOMINAL HYSTERECTOMY    . laproscopy      Prior to Admission medications   Medication Sig Start Date End Date Taking? Authorizing Provider  hydrOXYzine (VISTARIL) 25 MG capsule Take 1 capsule (25 mg total) by mouth 3 (three) times daily as needed for anxiety. 03/01/21  Yes Chesley Noon,  MD  omeprazole (PRILOSEC OTC) 20 MG tablet Take 1 tablet (20 mg total) by mouth daily. 03/01/21 03/01/22 Yes Chesley Noon, MD  acetaminophen (TYLENOL) 500 MG tablet Take 1,000 mg by mouth every 6 (six) hours as needed for headache.    [provider]  diphenhydramine-acetaminophen (TYLENOL PM) 25-500 MG TABS tablet Take 2 tablets by mouth at bedtime as needed. For sleep    [provider]  HYDROcodone-acetaminophen (NORCO/VICODIN) 5-325 MG tablet Take 1 tablet by mouth every 6 (six) hours as needed for moderate pain. Patient not taking: Reported on 09/30/2019 12/17/18   Emily Filbert, MD  lidocaine (LIDODERM) 5 % Place 1 patch onto the skin every 12 (twelve) hours. Remove & Discard patch within 12 hours or as directed by MD 08/23/20 08/23/21  Joni Reining, PA-C    Allergies Aspirin, Percocet [oxycodone-acetaminophen], and Tramadol  Family History  Problem Relation Age of Onset  . Hypertension Other   . Diabetes Other   . Cancer Other     Social History Social History   Tobacco Use  . Smoking status: Passive Smoke Exposure - Never Smoker  . Smokeless tobacco: Never Used  Substance Use Topics  . Alcohol use: Yes    Comment: occ  . Drug use: No    Review of Systems  Constitutional: No fever/chills Eyes: No visual changes. ENT: No sore throat.  Cardiovascular: Positive for chest pain. Respiratory: Positive for shortness of breath. Gastrointestinal: No abdominal pain.  No nausea, no vomiting.  No diarrhea.  No constipation. Genitourinary: Negative for dysuria. Musculoskeletal: Negative for back pain. Skin: Negative for rash. Neurological: Negative for headaches, focal weakness or numbness.  Positive for anxiety.  ____________________________________________   PHYSICAL EXAM:  VITAL SIGNS: ED Triage Vitals  Enc Vitals Group     BP 03/01/21 0454 (!) 156/79     Pulse Rate 03/01/21 0454 92     Resp 03/01/21 0454 20     Temp 03/01/21 0454 98.3 F  (36.8 C)     Temp Source 03/01/21 0454 Oral     SpO2 03/01/21 0454 99 %     Weight 03/01/21 0451 290 lb (131.5 kg)     Height 03/01/21 0451 5\' 8"  (1.727 m)     Head Circumference --      Peak Flow --      Pain Score 03/01/21 0451 9     Pain Loc --      Pain Edu? --      Excl. in GC? --     Constitutional: Alert and oriented. Eyes: Conjunctivae are normal. Head: Atraumatic. Nose: No congestion/rhinnorhea. Mouth/Throat: Mucous membranes are moist. Neck: Normal ROM Cardiovascular: Normal rate, regular rhythm. Grossly normal heart sounds.  2+ radial pulses bilaterally. Respiratory: Normal respiratory effort.  No retractions. Lungs CTAB.  No chest wall tenderness to palpation. Gastrointestinal: Soft and nontender. No distention. Genitourinary: deferred Musculoskeletal: No lower extremity tenderness nor edema. Neurologic:  Normal speech and language. No gross focal neurologic deficits are appreciated. Skin:  Skin is warm, dry and intact. No rash noted. Psychiatric: Mood and affect are normal. Speech and behavior are normal.  ____________________________________________   LABS (all labs ordered are listed, but only abnormal results are displayed)  Labs Reviewed  BASIC METABOLIC PANEL - Abnormal; Notable for the following components:      Result Value   Glucose, Bld 112 (*)    Calcium 8.7 (*)    All other components within normal limits  CBC  TROPONIN I (HIGH SENSITIVITY)  TROPONIN I (HIGH SENSITIVITY)   ____________________________________________  EKG  ED ECG REPORT I, 03/03/21, the attending physician, personally viewed and interpreted this ECG.   Date: 03/01/2021  EKG Time: 4:53  Rate: 89  Rhythm: normal sinus rhythm  Axis: Normal  Intervals:none  ST&T Change: None   PROCEDURES  Procedure(s) performed (including Critical Care):  Procedures   ____________________________________________   INITIAL IMPRESSION / ASSESSMENT AND PLAN / ED COURSE        45 year old female with past medical history of asthma presents to the ED complaining of intermittent chest pain over multiple days with associated shortness of breath, symptoms slightly worse today after episode of vomiting.  It does sound like patient has longstanding anxiety which contributed to the pain in her chest and difficulty catching her breath.  Work-up in the ED here has been unremarkable, EKG shows no evidence of arrhythmia or ischemia and troponin is negative.  Chest x-ray reviewed by me and shows no infiltrate, edema, or effusion.  I doubt PE as patient is PERC negative.  She may also have a component of GERD contributing to her chest pain given description of pain as burning after vomiting.  She is appropriate for discharge home with PCP follow-up, we will start her on a PPI and also prescribe Atarax for use as needed for anxiety.  She was counseled to return  to the ED for new worsening symptoms, patient agrees with plan.      ____________________________________________   FINAL CLINICAL IMPRESSION(S) / ED DIAGNOSES  Final diagnoses:  Atypical chest pain  Anxiety     ED Discharge Orders         Ordered    omeprazole (PRILOSEC OTC) 20 MG tablet  Daily        03/01/21 0624    hydrOXYzine (VISTARIL) 25 MG capsule  3 times daily PRN        03/01/21 1610           Note:  This document was prepared using Dragon voice recognition software and may include unintentional dictation errors.   Chesley Noon, MD 03/01/21 0630

## 2021-03-01 NOTE — ED Triage Notes (Signed)
Pt to ED POV for centralized cp radiating to left and shob that started this am.  Reports hx anxiety and has "a lot going on". Tearful in triage +nausea Pt in NAD, RR Even and unlabored

## 2022-05-16 IMAGING — DX DG WRIST COMPLETE 3+V*R*
4 series · 4 of 4 positions shown · non-contrast
Comparison: None.

CLINICAL DATA: Right wrist pain after fall

EXAM:
RIGHT WRIST - COMPLETE 3+ VIEW

[wrist ap (1 of 2)]
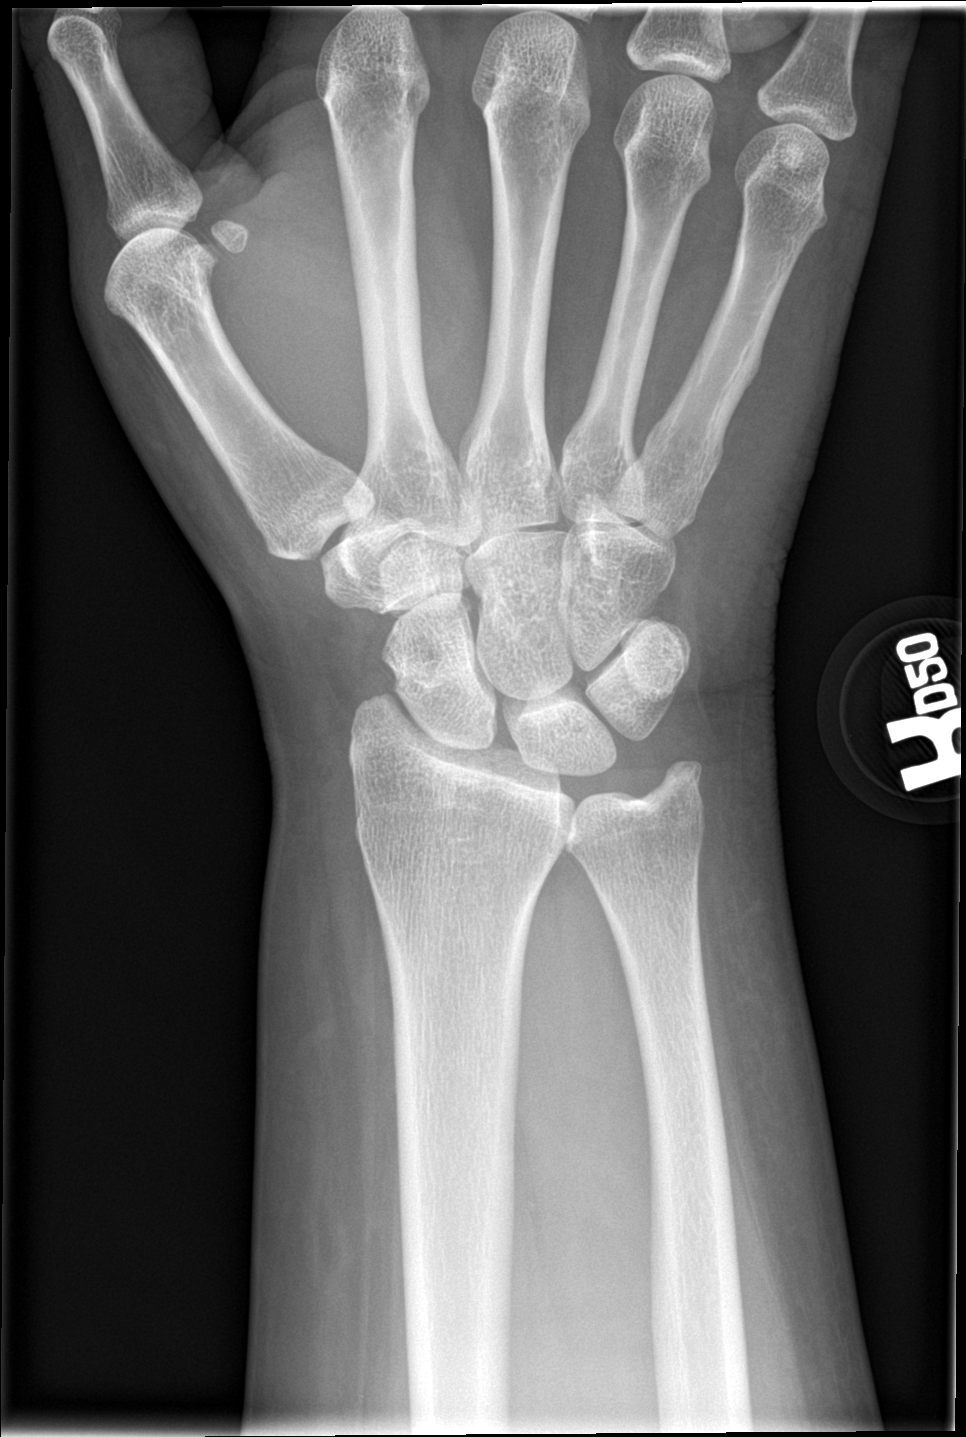

[wrist obl]
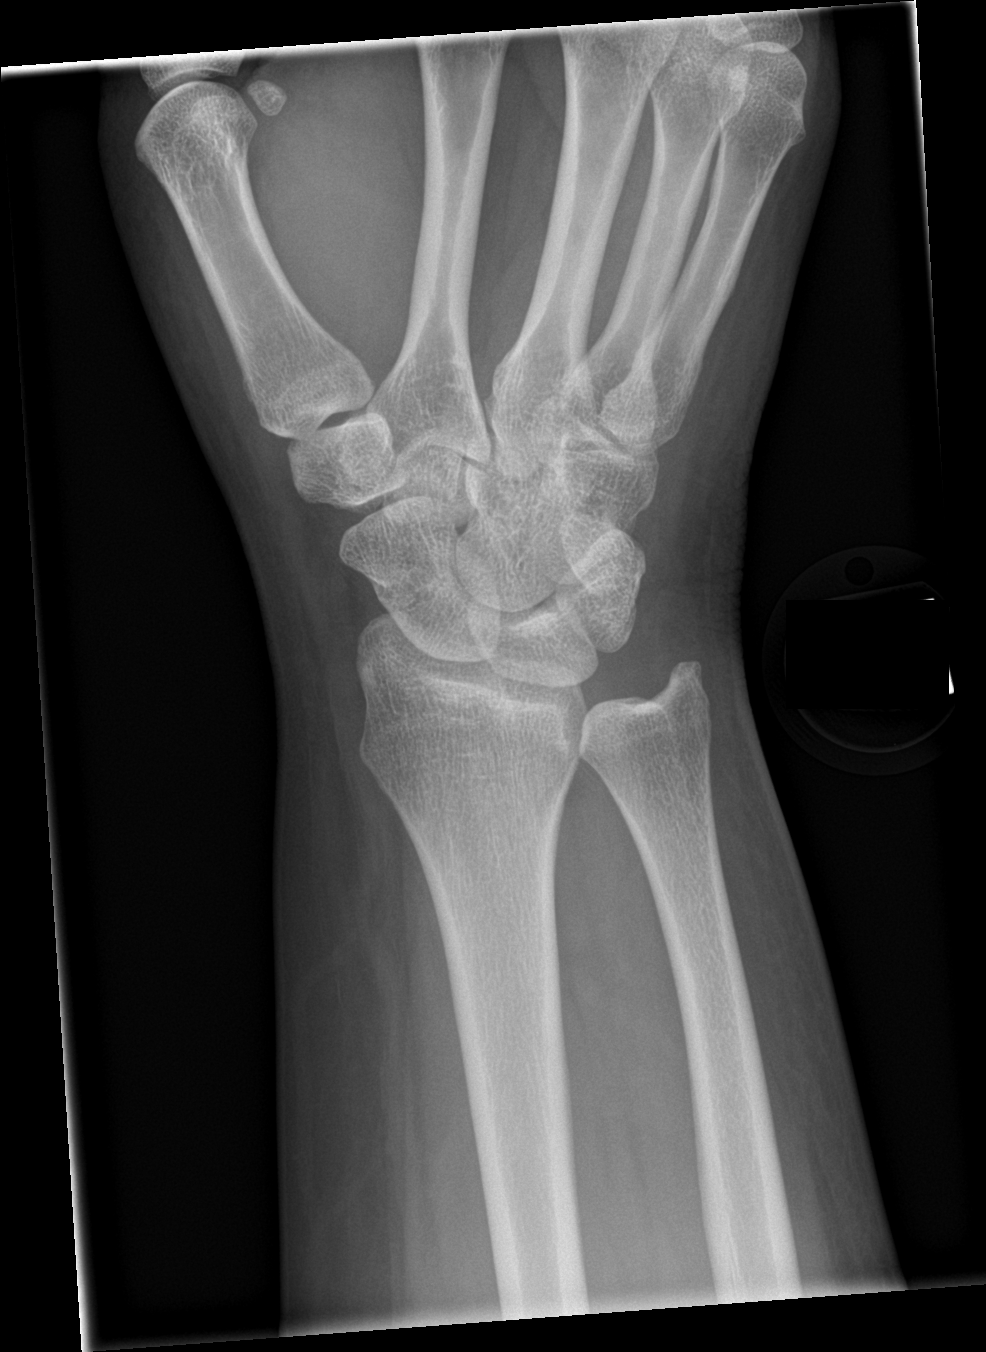

[wrist lat]
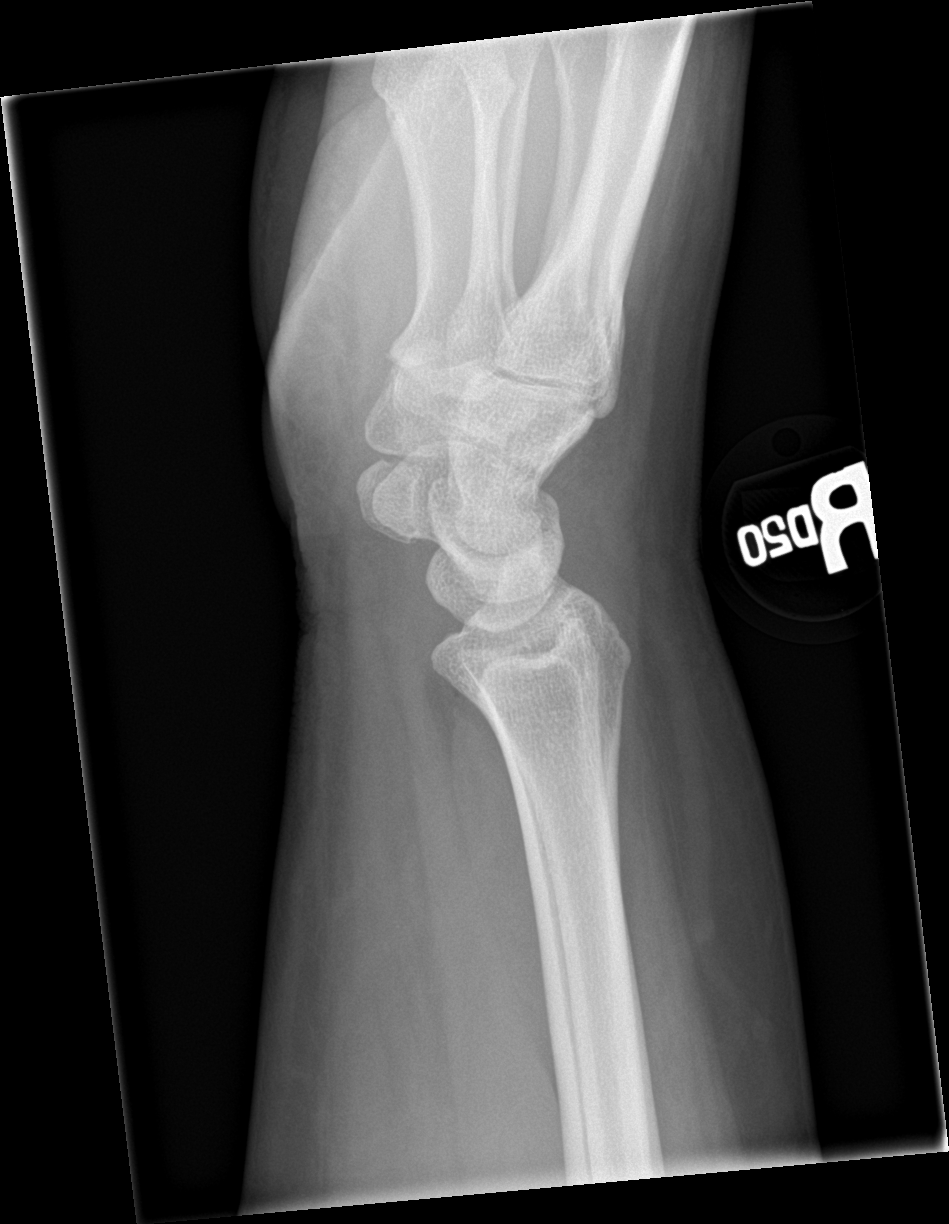

[wrist ap (2 of 2)]
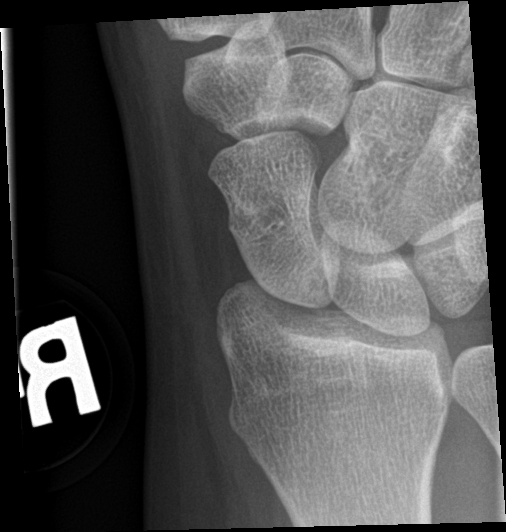

[4 of 4 positions shown; findings below may reference images not displayed]

FINDINGS: Irregular cortical lucency at the scaphoid waist suspicious for
nondisplaced fracture. Remaining carpal bones appear intact. Carpal
intraosseous spaces are maintained. Radiocarpal alignment is normal.
No significant arthropathy. Mild soft tissue swelling.
IMPRESSION: Irregular cortical lucency at the scaphoid waist suspicious for
nondisplaced fracture.

## 2022-09-12 DIAGNOSIS — Z886 Allergy status to analgesic agent status: Secondary | ICD-10-CM | POA: Diagnosis not present

## 2022-09-12 DIAGNOSIS — R519 Headache, unspecified: Secondary | ICD-10-CM | POA: Diagnosis not present

## 2022-09-12 DIAGNOSIS — Z87891 Personal history of nicotine dependence: Secondary | ICD-10-CM | POA: Diagnosis not present

## 2022-09-12 DIAGNOSIS — R079 Chest pain, unspecified: Secondary | ICD-10-CM | POA: Diagnosis not present

## 2022-09-12 DIAGNOSIS — Z885 Allergy status to narcotic agent status: Secondary | ICD-10-CM | POA: Diagnosis not present

## 2022-09-12 DIAGNOSIS — R030 Elevated blood-pressure reading, without diagnosis of hypertension: Secondary | ICD-10-CM | POA: Diagnosis not present

## 2022-09-12 DIAGNOSIS — Z6379 Other stressful life events affecting family and household: Secondary | ICD-10-CM | POA: Diagnosis not present

## 2022-09-12 DIAGNOSIS — F419 Anxiety disorder, unspecified: Secondary | ICD-10-CM | POA: Diagnosis not present

## 2022-09-13 DIAGNOSIS — Z1389 Encounter for screening for other disorder: Secondary | ICD-10-CM | POA: Diagnosis not present

## 2022-09-13 DIAGNOSIS — R030 Elevated blood-pressure reading, without diagnosis of hypertension: Secondary | ICD-10-CM | POA: Diagnosis not present

## 2022-09-13 DIAGNOSIS — R519 Headache, unspecified: Secondary | ICD-10-CM | POA: Diagnosis not present

## 2022-09-13 DIAGNOSIS — G43909 Migraine, unspecified, not intractable, without status migrainosus: Secondary | ICD-10-CM | POA: Diagnosis not present

## 2022-09-24 DIAGNOSIS — R69 Illness, unspecified: Secondary | ICD-10-CM | POA: Diagnosis not present

## 2022-11-21 DIAGNOSIS — F339 Major depressive disorder, recurrent, unspecified: Secondary | ICD-10-CM | POA: Diagnosis not present

## 2022-11-21 DIAGNOSIS — R69 Illness, unspecified: Secondary | ICD-10-CM | POA: Diagnosis not present

## 2022-11-21 DIAGNOSIS — R030 Elevated blood-pressure reading, without diagnosis of hypertension: Secondary | ICD-10-CM | POA: Diagnosis not present

## 2022-11-21 DIAGNOSIS — M79671 Pain in right foot: Secondary | ICD-10-CM | POA: Diagnosis not present

## 2022-11-21 DIAGNOSIS — Z1331 Encounter for screening for depression: Secondary | ICD-10-CM | POA: Diagnosis not present

## 2022-11-21 DIAGNOSIS — Z1389 Encounter for screening for other disorder: Secondary | ICD-10-CM | POA: Diagnosis not present

## 2022-11-22 DIAGNOSIS — R7303 Prediabetes: Secondary | ICD-10-CM | POA: Diagnosis not present

## 2022-11-22 DIAGNOSIS — R030 Elevated blood-pressure reading, without diagnosis of hypertension: Secondary | ICD-10-CM | POA: Diagnosis not present

## 2022-12-05 DIAGNOSIS — E119 Type 2 diabetes mellitus without complications: Secondary | ICD-10-CM | POA: Diagnosis not present

## 2023-02-07 ENCOUNTER — Other Ambulatory Visit: Payer: Self-pay | Admitting: Nurse Practitioner

## 2023-02-07 DIAGNOSIS — M79671 Pain in right foot: Secondary | ICD-10-CM

## 2024-01-06 ENCOUNTER — Other Ambulatory Visit: Payer: Self-pay | Admitting: Nurse Practitioner

## 2024-01-06 DIAGNOSIS — Z1231 Encounter for screening mammogram for malignant neoplasm of breast: Secondary | ICD-10-CM

## 2024-01-16 ENCOUNTER — Ambulatory Visit
Admission: RE | Admit: 2024-01-16 | Discharge: 2024-01-16 | Disposition: A | Discharge status: Home / Self Care | Source: Ambulatory Visit | Attending: Nurse Practitioner | Admitting: Nurse Practitioner

## 2024-01-16 DIAGNOSIS — Z1231 Encounter for screening mammogram for malignant neoplasm of breast: Secondary | ICD-10-CM | POA: Diagnosis present

## 2024-01-26 ENCOUNTER — Other Ambulatory Visit: Payer: Self-pay | Admitting: Nurse Practitioner

## 2024-01-26 DIAGNOSIS — R928 Other abnormal and inconclusive findings on diagnostic imaging of breast: Secondary | ICD-10-CM

## 2024-01-29 ENCOUNTER — Other Ambulatory Visit

## 2024-01-29 ENCOUNTER — Encounter
